# Patient Record
Sex: Female | Born: 1955
Health system: Southern US, Community
[De-identification: ages and names within clinical notes are randomized; demographics above are authoritative.]

## PROBLEM LIST (undated history)

## (undated) DIAGNOSIS — I10 Essential (primary) hypertension: Secondary | ICD-10-CM

## (undated) DIAGNOSIS — F329 Major depressive disorder, single episode, unspecified: Secondary | ICD-10-CM

## (undated) DIAGNOSIS — K222 Esophageal obstruction: Secondary | ICD-10-CM

## (undated) DIAGNOSIS — K219 Gastro-esophageal reflux disease without esophagitis: Secondary | ICD-10-CM

## (undated) DIAGNOSIS — M1990 Unspecified osteoarthritis, unspecified site: Secondary | ICD-10-CM

## (undated) DIAGNOSIS — Z9889 Other specified postprocedural states: Secondary | ICD-10-CM

## (undated) DIAGNOSIS — E28319 Asymptomatic premature menopause: Secondary | ICD-10-CM

## (undated) DIAGNOSIS — J189 Pneumonia, unspecified organism: Secondary | ICD-10-CM

## (undated) DIAGNOSIS — S8391XA Sprain of unspecified site of right knee, initial encounter: Secondary | ICD-10-CM

## (undated) DIAGNOSIS — M8580 Other specified disorders of bone density and structure, unspecified site: Secondary | ICD-10-CM

## (undated) DIAGNOSIS — F32A Depression, unspecified: Secondary | ICD-10-CM

## (undated) DIAGNOSIS — K5730 Diverticulosis of large intestine without perforation or abscess without bleeding: Secondary | ICD-10-CM

## (undated) DIAGNOSIS — K429 Umbilical hernia without obstruction or gangrene: Secondary | ICD-10-CM

## (undated) DIAGNOSIS — L600 Ingrowing nail: Secondary | ICD-10-CM

## (undated) DIAGNOSIS — G8929 Other chronic pain: Secondary | ICD-10-CM

## (undated) DIAGNOSIS — F419 Anxiety disorder, unspecified: Secondary | ICD-10-CM

## (undated) DIAGNOSIS — R112 Nausea with vomiting, unspecified: Secondary | ICD-10-CM

## (undated) DIAGNOSIS — F17200 Nicotine dependence, unspecified, uncomplicated: Secondary | ICD-10-CM

## (undated) DIAGNOSIS — R7303 Prediabetes: Secondary | ICD-10-CM

## (undated) DIAGNOSIS — R1031 Right lower quadrant pain: Secondary | ICD-10-CM

## (undated) DIAGNOSIS — E785 Hyperlipidemia, unspecified: Secondary | ICD-10-CM

## (undated) DIAGNOSIS — M65311 Trigger thumb, right thumb: Secondary | ICD-10-CM

## (undated) DIAGNOSIS — E559 Vitamin D deficiency, unspecified: Secondary | ICD-10-CM

## (undated) HISTORY — DX: Other chronic pain: G89.29

## (undated) HISTORY — DX: Ingrowing nail: L60.0

## (undated) HISTORY — DX: Essential (primary) hypertension: I10

## (undated) HISTORY — DX: Sprain of unspecified site of right knee, initial encounter: S83.91XA

## (undated) HISTORY — DX: Anxiety disorder, unspecified: F41.9

## (undated) HISTORY — DX: Major depressive disorder, single episode, unspecified: F32.9

## (undated) HISTORY — PX: UPPER GASTROINTESTINAL ENDOSCOPY: SHX188

## (undated) HISTORY — DX: Right lower quadrant pain: R10.31

## (undated) HISTORY — DX: Trigger thumb, right thumb: M65.311

## (undated) HISTORY — DX: Diverticulosis of large intestine without perforation or abscess without bleeding: K57.30

## (undated) HISTORY — DX: Vitamin D deficiency, unspecified: E55.9

## (undated) HISTORY — PX: BUNIONECTOMY: SHX129

## (undated) HISTORY — DX: Esophageal obstruction: K22.2

## (undated) HISTORY — DX: Unspecified osteoarthritis, unspecified site: M19.90

## (undated) HISTORY — DX: Depression, unspecified: F32.A

## (undated) HISTORY — PX: TONSILLECTOMY: SUR1361

## (undated) HISTORY — DX: Asymptomatic premature menopause: E28.319

## (undated) HISTORY — DX: Gastro-esophageal reflux disease without esophagitis: K21.9

## (undated) HISTORY — DX: Nicotine dependence, unspecified, uncomplicated: F17.200

## (undated) HISTORY — DX: Pneumonia, unspecified organism: J18.9

## (undated) HISTORY — DX: Other specified disorders of bone density and structure, unspecified site: M85.80

## (undated) HISTORY — DX: Hyperlipidemia, unspecified: E78.5

---

## 1983-03-20 HISTORY — PX: LAPAROSCOPIC OVARIAN CYSTECTOMY: SUR786

## 1986-03-19 HISTORY — PX: COLONOSCOPY: SHX174

## 1987-03-20 DIAGNOSIS — G8929 Other chronic pain: Secondary | ICD-10-CM

## 1987-03-20 HISTORY — DX: Other chronic pain: G89.29

## 1996-03-19 LAB — HM DEXA SCAN

## 1996-11-17 HISTORY — PX: COLONOSCOPY: SHX174

## 1998-08-09 ENCOUNTER — Other Ambulatory Visit: Admission: RE | Admit: 1998-08-09 | Discharge: 1998-08-09 | Payer: Self-pay | Admitting: Obstetrics and Gynecology

## 1999-08-21 ENCOUNTER — Other Ambulatory Visit: Admission: RE | Admit: 1999-08-21 | Discharge: 1999-08-21 | Payer: Self-pay | Admitting: Obstetrics and Gynecology

## 2000-08-30 ENCOUNTER — Other Ambulatory Visit: Admission: RE | Admit: 2000-08-30 | Discharge: 2000-08-30 | Payer: Self-pay | Admitting: Obstetrics and Gynecology

## 2001-03-19 HISTORY — PX: HYSTEROSCOPY: SHX211

## 2001-06-13 ENCOUNTER — Ambulatory Visit (HOSPITAL_COMMUNITY): Admission: RE | Admit: 2001-06-13 | Discharge: 2001-06-13 | Payer: Self-pay | Admitting: Obstetrics and Gynecology

## 2001-06-13 ENCOUNTER — Encounter (INDEPENDENT_AMBULATORY_CARE_PROVIDER_SITE_OTHER): Payer: Self-pay

## 2001-09-02 ENCOUNTER — Other Ambulatory Visit: Admission: RE | Admit: 2001-09-02 | Discharge: 2001-09-02 | Payer: Self-pay | Admitting: Obstetrics and Gynecology

## 2002-01-17 HISTORY — PX: COLONOSCOPY: SHX174

## 2002-09-14 ENCOUNTER — Other Ambulatory Visit: Admission: RE | Admit: 2002-09-14 | Discharge: 2002-09-14 | Payer: Self-pay | Admitting: Obstetrics and Gynecology

## 2004-01-14 ENCOUNTER — Other Ambulatory Visit: Admission: RE | Admit: 2004-01-14 | Discharge: 2004-01-14 | Payer: Self-pay | Admitting: Obstetrics and Gynecology

## 2004-11-15 ENCOUNTER — Ambulatory Visit (HOSPITAL_BASED_OUTPATIENT_CLINIC_OR_DEPARTMENT_OTHER): Admission: RE | Admit: 2004-11-15 | Discharge: 2004-11-15 | Payer: Self-pay | Admitting: Orthopedic Surgery

## 2004-11-15 ENCOUNTER — Ambulatory Visit (HOSPITAL_COMMUNITY): Admission: RE | Admit: 2004-11-15 | Discharge: 2004-11-15 | Payer: Self-pay | Admitting: Orthopedic Surgery

## 2004-11-17 HISTORY — PX: CHEILECTOMY: SHX1336

## 2005-03-26 ENCOUNTER — Other Ambulatory Visit: Admission: RE | Admit: 2005-03-26 | Discharge: 2005-03-26 | Payer: Self-pay | Admitting: Obstetrics and Gynecology

## 2006-02-22 ENCOUNTER — Encounter: Admission: RE | Admit: 2006-02-22 | Discharge: 2006-02-22 | Payer: Self-pay | Admitting: Obstetrics and Gynecology

## 2006-04-17 ENCOUNTER — Other Ambulatory Visit: Admission: RE | Admit: 2006-04-17 | Discharge: 2006-04-17 | Payer: Self-pay | Admitting: Obstetrics & Gynecology

## 2006-05-22 ENCOUNTER — Encounter: Admission: RE | Admit: 2006-05-22 | Discharge: 2006-05-22 | Payer: Self-pay | Admitting: Obstetrics and Gynecology

## 2006-05-29 ENCOUNTER — Encounter: Admission: RE | Admit: 2006-05-29 | Discharge: 2006-05-29 | Payer: Self-pay | Admitting: Obstetrics and Gynecology

## 2006-06-11 ENCOUNTER — Ambulatory Visit (HOSPITAL_BASED_OUTPATIENT_CLINIC_OR_DEPARTMENT_OTHER): Admission: RE | Admit: 2006-06-11 | Discharge: 2006-06-11 | Payer: Self-pay | Admitting: General Surgery

## 2006-06-11 ENCOUNTER — Encounter (INDEPENDENT_AMBULATORY_CARE_PROVIDER_SITE_OTHER): Payer: Self-pay | Admitting: *Deleted

## 2006-06-11 HISTORY — PX: BREAST EXCISIONAL BIOPSY: SUR124

## 2006-06-18 HISTORY — PX: MM BREAST STEREO BX*L*R/S: HXRAD495

## 2007-04-21 ENCOUNTER — Other Ambulatory Visit: Admission: RE | Admit: 2007-04-21 | Discharge: 2007-04-21 | Payer: Self-pay | Admitting: Obstetrics and Gynecology

## 2007-06-03 ENCOUNTER — Encounter: Admission: RE | Admit: 2007-06-03 | Discharge: 2007-06-03 | Payer: Self-pay | Admitting: Obstetrics and Gynecology

## 2008-03-09 ENCOUNTER — Ambulatory Visit: Payer: Self-pay | Admitting: Gastroenterology

## 2008-03-19 HISTORY — PX: COLONOSCOPY W/ BIOPSIES: SHX1374

## 2008-03-23 ENCOUNTER — Ambulatory Visit: Payer: Self-pay | Admitting: Gastroenterology

## 2008-04-15 ENCOUNTER — Emergency Department (HOSPITAL_COMMUNITY): Admission: EM | Admit: 2008-04-15 | Discharge: 2008-04-15 | Payer: Self-pay | Admitting: Emergency Medicine

## 2008-04-22 ENCOUNTER — Other Ambulatory Visit: Admission: RE | Admit: 2008-04-22 | Discharge: 2008-04-22 | Payer: Self-pay | Admitting: Obstetrics and Gynecology

## 2008-07-15 ENCOUNTER — Encounter: Admission: RE | Admit: 2008-07-15 | Discharge: 2008-07-15 | Payer: Self-pay | Admitting: Obstetrics and Gynecology

## 2009-08-22 ENCOUNTER — Encounter: Admission: RE | Admit: 2009-08-22 | Discharge: 2009-08-22 | Payer: Self-pay | Admitting: Obstetrics & Gynecology

## 2010-07-03 LAB — BASIC METABOLIC PANEL
Calcium: 9.5 mg/dL (ref 8.4–10.5)
Chloride: 107 mEq/L (ref 96–112)
GFR calc non Af Amer: >60 mL/min (ref >60)
Glucose, Bld: 98 mg/dL (ref 70–99)

## 2010-07-03 LAB — CBC
HCT: 39 % (ref 36.0–46.0)
Hemoglobin: 13.3 g/dL (ref 12.0–15.0)
MCHC: 34.1 g/dL (ref 30.0–36.0)
MCV: 91.9 fL (ref 78.0–100.0)
Platelets: 322 10*3/uL (ref 150–400)
WBC: 7.3 10*3/uL (ref 4.0–10.5)

## 2010-07-03 LAB — URINALYSIS, ROUTINE W REFLEX MICROSCOPIC
Bilirubin Urine: NEGATIVE
Glucose, UA: NEGATIVE mg/dL
Hgb urine dipstick: NEGATIVE
Specific Gravity, Urine: 1.001 — ABNORMAL LOW (ref 1.005–1.030)
Urobilinogen, UA: 0.2 mg/dL (ref 0.0–1.0)
pH: 7.5 (ref 5.0–8.0)

## 2010-07-03 LAB — DIFFERENTIAL
Basophils Absolute: 0 10*3/uL (ref 0.0–0.1)
Basophils Relative: 0 % (ref 0–1)
Eosinophils Relative: 1 % (ref 0–5)

## 2010-08-04 NOTE — Op Note (Signed)
NAME:  NABA, SNEED NO.:  [id number]   MEDICAL RECORD NO.:  [id number]          PATIENT TYPE:  AMB   LOCATION:  DSC                          FACILITY:  [facility]   PHYSICIAN:  Leonides Grills, M.D.     DATE OF BIRTH:  [birth year]   DATE OF PROCEDURE:  11/15/2004  DATE OF DISCHARGE:                                 OPERATIVE REPORT   PREOPERATIVE DIAGNOSIS:  Right hallux rigidus.   POSTOPERATIVE DIAGNOSIS:  Right hallux rigidus.   OPERATION:  Right great toe chielectomy.   ANESTHESIA:  General.   SURGEON:  Leonides Grills, M.D.   ASSISTANT:  Lianne Cure, P.A.-C.   ESTIMATED BLOOD LOSS:  Minimal.   TOURNIQUET TIME:  Was 30 minutes.   COMPLICATIONS:  None.   DISPOSITION:  Stable to the PAR.   INDICATIONS FOR PROCEDURE:  This is a 55 year old female who has had  longstanding dorsal right great toe pain that was interfering with her life  and with things she wants to do. She was consented for the above procedure.  All risks, which include infection, nerve or vessel injury, persistent pain,  worsening pain, stiffness, arthritis, prolonged recovery and possible future  fusion were all explained.  Questions were encouraged and answered.   DESCRIPTION OF PROCEDURE:  The patient was brought to the operating room and  placed in the supine position.  After adequate general endotracheal tube  anesthesia was administered, as well as Ancef 1 gram IV piggyback, the right  lower extremity was then prepped and draped in a sterile manner over a  proximally-placed thigh tourniquet.  The limb was gravity exsanguinated and  the tourniquet was elevated to 290 mmHg.  A longitudinal incision just  medial to the EHL tendon was then made.  Dissection was carried through the  skin.  Hemostasis was obtained.  The EHL tendon was then protected within  its sheath and was not violated.  A longitudinal capsulotomy was then made.  There was a large amount of effusion and synovitis in this  area, and spur  formation.  There was a loose body as well.  The loose body was removed with  a rongeur and scalpel.  We then removed the dorsal spur with the sagittal  saw.  Once this was done, the medial and lateral gutters were then debrided  with the rongeur.  Range of motion of the toe was outstanding, almost 90  degrees dorsiflexion.  The joint and area were copiously irrigated with  normal saline.  Bone wax was applied to exposed bony surfaces.  The capsule  was  closed with #3-0 Vicryl suture.  The tourniquet was deflated.  Hemostasis  was obtained.  The subcu was closed with #3-0 Vicryl.  The skin was closed  with #4-0 nylon.  A sterile dressing was applied.  A hard-soled shoe was  applied.   The patient was stable to the PAR.      Leonides Grills, M.D.  Electronically Signed     PB/MEDQ  D:  11/15/2004  T:  11/15/2004  Job:  [id number]

## 2010-08-04 NOTE — Op Note (Signed)
NAME:  Alexandria Mercado, Alexandria Mercado              ACCOUNT NO.:  [id number]   MEDICAL RECORD NO.:  [id number]          PATIENT TYPE:  AMB   LOCATION:  DSC                          FACILITY:  [facility]   PHYSICIAN:  Rose Phi. Maple Hudson, M.D.   DATE OF BIRTH:  1955-10-28   DATE OF PROCEDURE:  06/11/2006  DATE OF DISCHARGE:                               OPERATIVE REPORT   PREOPERATIVE DIAGNOSIS:  Intraductal papilloma of the right breast.   POSTOPERATIVE DIAGNOSIS:  Intraductal papilloma of the right breast.   OPERATION:  Excision of duct system of the right breast.   SURGEON:  Rose Phi. Maple Hudson, M.D.   ANESTHESIA:  MAC.   OPERATIVE PROCEDURE:  The patient was placed on the operating table with  arms extended on the arm board and the right breast prepped and draped  in the usual fashion.  Her discharge has always then between the 12 and  two o'clock position.  Two attempts at ductograms had been failures.   After prepping and draping the breast.  A circumareolar incision  extending from the two to three o'clock position was outlined with a  marking pencil and the area thoroughly infiltrated with local anesthetic  mixture.  The incision was then made and dissected up an areolar flap.  As you approached underneath the nipple, one could see the tissue and  the bloody fluid within the duct system.  I excised this whole wedge of  duct tissue including some extra tissue deep to it where it extended a  little more deeply in the central portion.  We had good hemostasis with  cautery.  A subcuticular closure of 4-0 Monocryl with Dermabond was then  carried out.  A light dressing applied.  The patient transferred to the  recovery room in satisfactory condition having tolerated the procedure  well.      Rose Phi. Maple Hudson, M.D.  Electronically Signed     PRY/MEDQ  D:  06/11/2006  T:  06/11/2006  Job:  [id number]

## 2010-08-04 NOTE — Op Note (Signed)
[facility]  Patient:    Alexandria Mercado, Alexandria Mercado Visit Number: [id number] MRN: [id number]          Service Type: DSU Location: [facility] Attending Physician:  Jenean Lindau Dictated by:   Laqueta Linden, M.D. Proc. Date: 06/13/01 Admit Date:  06/13/2001                             Operative Report  PREOPERATIVE DIAGNOSIS:       Endometrial polyp.  POSTOPERATIVE DIAGNOSIS:      Endometrial polyp.  OPERATION:                    Hysteroscopic resection.  SURGEON:                      Laqueta Linden, M.D.  ANESTHESIA:                   General LMA.  ESTIMATED BLOOD LOSS:         Less than 20 cc.  SORBITOL NET INTAKE:          70 cc.  COMPLICATIONS:                None.  INDICATIONS:                  The patient is a 55 year old female who underwent premature menopause and is on hormonal replacement therapy.  She was having some adnexal pain and fear of ovarian malignancy, and underwent a pelvic ultrasound which revealed completely normal ovaries.  It did however reveal the suggestion of an endometrial polyp.  Sonohistogram confirmed the presence of a 1.6 cm anterior lower uterine segment endometrial polyp.  The patient had had no postmenopausal bleeding.  Due to the presence of the possible likelihood of eventual bleeding from this, it was elected to proceed with elective resection of the polyp.  The patient has seen the informed consent film, and has voiced the understanding of risks, benefits, alternatives, and complications, and agrees to proceed.  DESCRIPTION OF PROCEDURE:     The patient was taken to the operating room and after proper identification and consents were ascertained, she was placed on the operating table in the supine position.  After general LMA anesthesia was induced, she was placed in the [place] stirrups, and the perineum and vagina were prepped and draped in a routine sterile fashion.  A transurethral Foley was placed which was  removed at the conclusion of the procedure.  Bimanual examination confirmed a mid plane to retroverted normal size mobile uterus.  A speculum was placed in the vagina and the cervix grasped with a single tooth tenaculum.  The internal os was gently dilated to a #33 Pratt dilator.  The resectoscope with double loupe was then inserted under direct vision with video with continuous monitoring of sorbitol intake and output.  The endocervical canal was free of lesions.  The anterior lower uterine segment and left lateral endometrium were obscured by a large broad-based polyp which extended up towards the fundus.  The left tubal ostia was not visualized prior to dissection of the polyp.  The right cornual region was visualized.  There were no other endometrial lesions identified.  The loupe was placed on routine settings and the polyp was resected in a systematic manner with cauterization of the base for hemostasis.  All pieces were evacuated and sent as a  separate specimen.  At this point, the left tubal ostia was visualized and appeared within normal limits.  No other lesions were noted.  Hemostasis was excellent.  The scope was then withdrawn.  Sharp curettage of the remainder of the endometrial cavity was then performed with a scant amount of tissue obtained.  This was sent as a separate specimen.  The tenaculum was then removed.  The tenaculum site was hemostatic.  There was a scant amount of bloody fluid per os.  Net sorbitol intake 70 cc.  Complications none.  The Foley catheter was removed.  The patient was awakened and stable and transferred to the recovery room.  She was given 30 mg of Toradol IV and 30 mg IM for control of cramping.  She will be observed and discharged in the recovery room per anesthesia protocol.  She is to follow up in the office in four weeks times or sooner if she experiences excessive pain, fever, bleeding, or other concerns.  She is given routine written and  discharge instructions, and told to take Advil or Aleve as needed for cramping. Dictated by:   Laqueta Linden, M.D. Attending Physician:  Jenean Lindau DD:  06/13/01 TD:  06/14/01 Job: [id number]

## 2010-11-21 ENCOUNTER — Other Ambulatory Visit (INDEPENDENT_AMBULATORY_CARE_PROVIDER_SITE_OTHER): Payer: Self-pay | Admitting: Surgery

## 2010-11-21 DIAGNOSIS — Z1231 Encounter for screening mammogram for malignant neoplasm of breast: Secondary | ICD-10-CM

## 2010-12-14 ENCOUNTER — Ambulatory Visit: Payer: Self-pay

## 2011-04-12 ENCOUNTER — Other Ambulatory Visit: Payer: Self-pay | Admitting: Nurse Practitioner

## 2011-04-12 DIAGNOSIS — Z1231 Encounter for screening mammogram for malignant neoplasm of breast: Secondary | ICD-10-CM

## 2011-05-10 ENCOUNTER — Ambulatory Visit
Admission: RE | Admit: 2011-05-10 | Discharge: 2011-05-10 | Disposition: A | Discharge status: Home / Self Care | Payer: BC Managed Care – PPO | Source: Ambulatory Visit | Attending: Nurse Practitioner | Admitting: Nurse Practitioner

## 2011-05-10 DIAGNOSIS — Z1231 Encounter for screening mammogram for malignant neoplasm of breast: Secondary | ICD-10-CM

## 2011-10-30 ENCOUNTER — Other Ambulatory Visit: Payer: Self-pay | Admitting: Obstetrics and Gynecology

## 2011-10-30 DIAGNOSIS — N644 Mastodynia: Secondary | ICD-10-CM

## 2011-11-08 ENCOUNTER — Ambulatory Visit
Admission: RE | Admit: 2011-11-08 | Discharge: 2011-11-08 | Disposition: A | Discharge status: Home / Self Care | Payer: BC Managed Care – PPO | Source: Ambulatory Visit | Attending: Obstetrics and Gynecology | Admitting: Obstetrics and Gynecology

## 2011-11-08 DIAGNOSIS — N644 Mastodynia: Secondary | ICD-10-CM

## 2012-06-09 ENCOUNTER — Other Ambulatory Visit (HOSPITAL_COMMUNITY): Payer: Self-pay | Admitting: Family Medicine

## 2012-06-09 DIAGNOSIS — Z1231 Encounter for screening mammogram for malignant neoplasm of breast: Secondary | ICD-10-CM

## 2012-06-18 ENCOUNTER — Ambulatory Visit (HOSPITAL_COMMUNITY)
Admission: RE | Admit: 2012-06-18 | Discharge: 2012-06-18 | Disposition: A | Discharge status: Home / Self Care | Payer: BC Managed Care – PPO | Source: Ambulatory Visit | Attending: Family Medicine | Admitting: Family Medicine

## 2012-06-18 DIAGNOSIS — Z1231 Encounter for screening mammogram for malignant neoplasm of breast: Secondary | ICD-10-CM | POA: Insufficient documentation

## 2012-10-30 ENCOUNTER — Encounter: Payer: Self-pay | Admitting: *Deleted

## 2012-10-31 ENCOUNTER — Encounter: Payer: Self-pay | Admitting: Nurse Practitioner

## 2012-10-31 ENCOUNTER — Ambulatory Visit (INDEPENDENT_AMBULATORY_CARE_PROVIDER_SITE_OTHER): Payer: BC Managed Care – PPO | Admitting: Nurse Practitioner

## 2012-10-31 VITALS — BP 128/72 | HR 76 | Resp 14 | Ht 68.0 in | Wt 224.0 lb

## 2012-10-31 DIAGNOSIS — Z0000 Encounter for general adult medical examination without abnormal findings: Secondary | ICD-10-CM

## 2012-10-31 DIAGNOSIS — Z01419 Encounter for gynecological examination (general) (routine) without abnormal findings: Secondary | ICD-10-CM

## 2012-10-31 LAB — POCT URINALYSIS DIPSTICK
Urobilinogen, UA: NEGATIVE
pH, UA: 6

## 2012-10-31 NOTE — Patient Instructions (Signed)

## 2012-10-31 NOTE — Progress Notes (Signed)
57 y.o. G0P0 Married Caucasian Fe here for annual exam.  Some increase in anxiety and now on Buspar. Still 'warm' in general without a lot of sleep disturbances.  No LMP recorded. Patient is postmenopausal.          Sexually active: no  The current method of family planning is post menopausal status.    Exercising: yes  walk and exercise bike Smoker:  no  Health Maintenance: Pap:  10/30/2011  Normal with negative HR HPV MMG: 06/19/12 normal Colonoscopy:  03/2008 no polyps, scattered diverticula BMD:   03/1996  -1.9/-0.9 plans on repeat within next year TDaP:  04/25/2009 Labs: PCP maintains labs.    reports that she has never smoked. She has never used smokeless tobacco. She reports that she does not drink alcohol or use illicit drugs.  Past Medical History  Diagnosis Date  . Chronic RLQ pain   . Smoker   . Amenorrhea   . Anovulation   . Osteoporosis   . Depression     grief counseling    Past Surgical History  Procedure Laterality Date  . Laparoscopic ovarian cystectomy Right 1985  . Hysteroscopic resection  05/2001   . Cheilectomy Right     great toe   . Mm breast stereo bx*l*r/s Right 06/2006     bloody nipple discharge   . Mouth surgery      Current Outpatient Prescriptions  Medication Sig Dispense Refill  . ALPRAZolam (XANAX) 0.25 MG tablet Take 0.25 mg by mouth at bedtime as needed for sleep.      . Ascorbic Acid (VITAMIN C) 100 MG tablet Take 100 mg by mouth daily.      . busPIRone (BUSPAR) 5 MG tablet Take 5 mg by mouth 2 (two) times daily.      . Calcium Carbonate (CALTRATE 600 PO) Take by mouth.      . cholecalciferol (VITAMIN D) 1000 UNITS tablet Take 1,000 Units by mouth daily.      . fish oil-omega-3 fatty acids 1000 MG capsule Take 2 g by mouth 2 (two) times daily.      . Multiple Vitamin (MULTIVITAMIN) tablet Take 1 tablet by mouth daily.       No current facility-administered medications for this visit.    Family History  Problem Relation Age of Onset  .  Cancer Maternal Uncle     colon cancer    ROS:  Pertinent items are noted in HPI.  Otherwise, a comprehensive ROS was negative.  Exam:   BP 128/72  Pulse 76  Resp 14  Ht 5\' 8"  (1.727 m)  Wt 224 lb (101.606 kg)  BMI 34.07 kg/m2 Height: 5\' 8"  (172.7 cm)  Ht Readings from Last 3 Encounters:  10/31/12 5\' 8"  (1.727 m)    General appearance: alert, cooperative and appears stated age Head: Normocephalic, without obvious abnormality, atraumatic Neck: no adenopathy, supple, symmetrical, trachea midline and thyroid normal to inspection and palpation Lungs: clear to auscultation bilaterally Breasts: normal appearance, no masses or tenderness Heart: regular rate and rhythm Abdomen: soft, non-tender; no masses,  no organomegaly Extremities: extremities normal, atraumatic, no cyanosis or edema Skin: Skin color, texture, turgor normal. No rashes or lesions Lymph nodes: Cervical, supraclavicular, and axillary nodes normal. No abnormal inguinal nodes palpated Neurologic: Grossly normal   Pelvic: External genitalia:  no lesions              Urethra:  normal appearing urethra with no masses, tenderness or lesions  Bartholin's and Skene's: normal                 Vagina: normal appearing vagina with normal color and discharge, no lesions              Cervix: anteverted              Pap taken: no Bimanual Exam:  Uterus:  normal size, contour, position, consistency, mobility, non-tender              Adnexa: no mass, fullness, tenderness               Rectovaginal: Confirms               Anus:  normal sphincter tone, no lesions  A:  Well Woman with normal exam  Premature Menopause on HRT age 36 - 2007  History of anxiety - recent increase now on new med's.  P:   Pap smear as per guidelines pap not done   Mammogram due 4/15  Counseled on breast self exam, adequate intake of calcium and vitamin D, diet and exercise return annually or prn  An After Visit Summary was printed and  given to the patient.

## 2012-11-03 NOTE — Progress Notes (Signed)
Encounter reviewed by Dr. Brook Silva.  

## 2013-01-22 ENCOUNTER — Other Ambulatory Visit: Payer: Self-pay

## 2013-03-11 ENCOUNTER — Encounter: Payer: Self-pay | Admitting: Nurse Practitioner

## 2013-05-20 ENCOUNTER — Other Ambulatory Visit (HOSPITAL_COMMUNITY): Payer: Self-pay | Admitting: Family Medicine

## 2013-05-20 DIAGNOSIS — Z1231 Encounter for screening mammogram for malignant neoplasm of breast: Secondary | ICD-10-CM

## 2013-06-22 ENCOUNTER — Ambulatory Visit (HOSPITAL_COMMUNITY)
Admission: RE | Admit: 2013-06-22 | Discharge: 2013-06-22 | Disposition: A | Discharge status: Home / Self Care | Payer: BC Managed Care – PPO | Source: Ambulatory Visit | Attending: Family Medicine | Admitting: Family Medicine

## 2013-06-22 DIAGNOSIS — Z1231 Encounter for screening mammogram for malignant neoplasm of breast: Secondary | ICD-10-CM | POA: Insufficient documentation

## 2013-10-06 ENCOUNTER — Encounter: Payer: Self-pay | Admitting: Gastroenterology

## 2013-10-06 ENCOUNTER — Telehealth: Payer: Self-pay | Admitting: Gastroenterology

## 2013-10-06 NOTE — Telephone Encounter (Signed)
Pt states she has seen some blood in her stool on several occasions with some white/mucous material. Pt is concerned that she may need to have a colon done. Pt scheduled to see Alonza Bogus PA 10/14/13@1 :30pm. Pt aware of appt.

## 2013-10-09 ENCOUNTER — Encounter: Payer: Self-pay | Admitting: *Deleted

## 2013-10-14 ENCOUNTER — Ambulatory Visit: Payer: BC Managed Care – PPO | Admitting: Gastroenterology

## 2013-11-26 ENCOUNTER — Ambulatory Visit: Payer: BC Managed Care – PPO | Admitting: Nurse Practitioner

## 2013-12-07 ENCOUNTER — Ambulatory Visit: Payer: BC Managed Care – PPO | Admitting: Nurse Practitioner

## 2013-12-16 ENCOUNTER — Ambulatory Visit: Payer: BC Managed Care – PPO | Admitting: Gastroenterology

## 2014-03-26 ENCOUNTER — Ambulatory Visit: Payer: BC Managed Care – PPO | Admitting: Nurse Practitioner

## 2014-05-26 ENCOUNTER — Encounter: Payer: Self-pay | Admitting: Nurse Practitioner

## 2014-05-26 ENCOUNTER — Ambulatory Visit (INDEPENDENT_AMBULATORY_CARE_PROVIDER_SITE_OTHER): Payer: BLUE CROSS/BLUE SHIELD | Admitting: Nurse Practitioner

## 2014-05-26 VITALS — BP 124/80 | HR 80 | Resp 20 | Ht 68.25 in | Wt 176.0 lb

## 2014-05-26 DIAGNOSIS — Z01419 Encounter for gynecological examination (general) (routine) without abnormal findings: Secondary | ICD-10-CM | POA: Diagnosis not present

## 2014-05-26 DIAGNOSIS — R350 Frequency of micturition: Secondary | ICD-10-CM | POA: Diagnosis not present

## 2014-05-26 DIAGNOSIS — E288 Other ovarian dysfunction: Secondary | ICD-10-CM | POA: Diagnosis not present

## 2014-05-26 DIAGNOSIS — E559 Vitamin D deficiency, unspecified: Secondary | ICD-10-CM

## 2014-05-26 DIAGNOSIS — Z0000 Encounter for general adult medical examination without abnormal findings: Secondary | ICD-10-CM | POA: Diagnosis not present

## 2014-05-26 DIAGNOSIS — Z1211 Encounter for screening for malignant neoplasm of colon: Secondary | ICD-10-CM

## 2014-05-26 DIAGNOSIS — E2839 Other primary ovarian failure: Secondary | ICD-10-CM

## 2014-05-26 LAB — POCT URINALYSIS DIPSTICK
BILIRUBIN UA: NEGATIVE
GLUCOSE UA: NEGATIVE
Ketones, UA: NEGATIVE
Leukocytes, UA: NEGATIVE
Nitrite, UA: NEGATIVE
PROTEIN UA: NEGATIVE
RBC UA: NEGATIVE
UROBILINOGEN UA: NEGATIVE
pH, UA: 5

## 2014-05-26 NOTE — Progress Notes (Signed)
59 y.o. G0P0 WM Fe here for annual exam.  She has been on [employer] this past year and has a weight loss of 57 lbs.  Patient's last menstrual period was 09/17/1990.         Sexually active: No.  The current method of family planning is vasectomy.    Exercising: Yes.    walk & bike Smoker:  no  Health Maintenance: Pap: 10-30-11 neg HPV HR neg MMG:  06-22-13 category b density,birads 2:neg Colonoscopy:  2010 repeat in 7 years. BMD:   1998 now due TDaP:  04/25/2009 Shingles: 11/2013 Labs: PCP and was normal - Dr. Inda Merlin; Poct urine-neg Self breast exam: done oc   reports that she has quit smoking. She has never used smokeless tobacco. She reports that she does not drink alcohol or use illicit drugs.  Past Medical History  Diagnosis Date  . Chronic RLQ pain 1989    secondary to adhesions  . Smoker   . Amenorrhea   . Anovulation 8/91  . Osteoporosis   . Depression     grief counseling  . Premature menopause age 34    On HRT age 32 - 2007  . Diverticulosis of colon (without mention of hemorrhage)     Past Surgical History  Procedure Laterality Date  . Laparoscopic ovarian cystectomy Right 1985  . Cheilectomy Right 9/06    great toe   . Mm breast stereo bx*l*r/s Right 06/2006     bloody nipple discharge - path reveals intraductal papilloma Dr. Annamaria Boots  . Mouth surgery    . Colonoscopy  1988    normal  . Colonoscopy  9/98    normal  . Colonoscopy  11/03    normal  . Colonoscopy w/ biopsies  03/2008    mild diverticula - recheck 7 years  . Hysteroscopy  06/13/01    Resection of Endo Polyp    Current Outpatient Prescriptions  Medication Sig Dispense Refill  . ALPRAZolam (XANAX) 0.5 MG tablet Take 0.5 mg by mouth every 8 (eight) hours as needed. for anxiety  1  . Ascorbic Acid (VITAMIN C) 100 MG tablet Take 100 mg by mouth daily.    . busPIRone (BUSPAR) 5 MG tablet Take 5 mg by mouth 2 (two) times daily.    . Calcium Carbonate (CALTRATE 600 PO) Take by mouth.    .  cholecalciferol (VITAMIN D) 1000 UNITS tablet Take 1,000 Units by mouth daily.    . cyclobenzaprine (FLEXERIL) 10 MG tablet as needed.  1  . fish oil-omega-3 fatty acids 1000 MG capsule Take 1 g by mouth daily.     . Multiple Vitamin (MULTIVITAMIN) tablet Take 1 tablet by mouth daily.     No current facility-administered medications for this visit.    Family History  Problem Relation Age of Onset  . Colon cancer Maternal Uncle   . Uterine cancer Mother   . Hyperlipidemia Mother     ROS:  Pertinent items are noted in HPI.  Otherwise, a comprehensive ROS was negative.  Exam:   BP 124/80 mmHg  Pulse 80  Resp 20  Ht 5' 8.25" (1.734 m)  Wt 176 lb (79.833 kg)  BMI 26.55 kg/m2  LMP 09/17/1990 Height: 5' 8.25" (173.4 cm) Ht Readings from Last 3 Encounters:  05/26/14 5' 8.25" (1.734 m)  10/31/12 5\' 8"  (1.727 m)    General appearance: alert, cooperative and appears stated age Head: Normocephalic, without obvious abnormality, atraumatic Neck: no adenopathy, supple, symmetrical, trachea midline and thyroid  normal to inspection and palpation Lungs: clear to auscultation bilaterally Breasts: normal appearance, no masses or tenderness Heart: regular rate and rhythm Abdomen: soft, non-tender; no masses,  no organomegaly Extremities: extremities normal, atraumatic, no cyanosis or edema Skin: Skin color, texture, turgor normal. No rashes or lesions Lymph nodes: Cervical, supraclavicular, and axillary nodes normal. No abnormal inguinal nodes palpated Neurologic: Grossly normal   Pelvic: External genitalia:  no lesions              Urethra:  normal appearing urethra with no masses, tenderness or lesions              Bartholin's and Skene's: normal                 Vagina: normal appearing vagina with normal color and discharge, no lesions              Cervix: anteverted              Pap taken: Yes.   Bimanual Exam:  Uterus:  normal size, contour, position, consistency, mobility,  non-tender              Adnexa: no mass, fullness, tenderness               Rectovaginal: Confirms               Anus:  normal sphincter tone, no lesions  Chaperone present: Yes  A:  Well Woman with normal exam  Premature Menopause on HRT age 59  HRT from 7/92- 2004, restarted 7/05 for short time. History of anxiety   Vit D deficiency  R/O UTI - asymptomatic  Weight loss intentally  P:   Reviewed health and wellness pertinent to exam  Pap smear taken today  Mammogram is due 4/16 and will get [facility] at same time - order is placed  Recheck Vit D and follow, and urine culture  Counseled on breast self exam, mammography screening, adequate intake of calcium and vitamin D, diet and exercise, Kegel's exercises return annually or prn  An After Visit Summary was printed and given to the patient.

## 2014-05-26 NOTE — Patient Instructions (Signed)

## 2014-05-27 LAB — URINE CULTURE
Colony Count: NO GROWTH
Organism ID, Bacteria: NO GROWTH

## 2014-05-27 LAB — VITAMIN D 25 HYDROXY (VIT D DEFICIENCY, FRACTURES): VIT D 25 HYDROXY: 35 ng/mL (ref 30–100)

## 2014-05-28 ENCOUNTER — Other Ambulatory Visit: Payer: Self-pay | Admitting: Nurse Practitioner

## 2014-05-28 ENCOUNTER — Telehealth: Payer: Self-pay | Admitting: *Deleted

## 2014-05-28 DIAGNOSIS — Z1231 Encounter for screening mammogram for malignant neoplasm of breast: Secondary | ICD-10-CM

## 2014-05-28 NOTE — Telephone Encounter (Signed)
-----   Message from Regina Eck, CNM sent at 05/28/2014  9:32 AM EST ----- Reviewed urine culture no growth no indication of infection Vitamin D low normal if not taking Vitamin D would recommend OTC Vit D 3 1000 IU daily

## 2014-05-28 NOTE — Telephone Encounter (Signed)
Pt notified in result note.  Closing encounter. 

## 2014-05-28 NOTE — Telephone Encounter (Signed)
I have attempted to contact this patient by phone with the following results: left message to return call to [place] at [phone] answering machine (home per [facility]). No personal information given. [phone] (Home) *Preferred*

## 2014-05-31 LAB — IPS PAP TEST WITH HPV

## 2014-05-31 NOTE — Progress Notes (Signed)
Encounter reviewed by Dr. Brook Silva.  

## 2014-06-09 LAB — FECAL OCCULT BLOOD, IMMUNOCHEMICAL: IFOBT: NEGATIVE

## 2014-06-09 NOTE — Addendum Note (Signed)
Addended by: Graylon Good on: 06/09/2014 09:46 AM   Modules accepted: Orders

## 2014-06-29 ENCOUNTER — Ambulatory Visit (HOSPITAL_COMMUNITY)
Admission: RE | Admit: 2014-06-29 | Discharge: 2014-06-29 | Disposition: A | Discharge status: Home / Self Care | Payer: BLUE CROSS/BLUE SHIELD | Source: Ambulatory Visit | Attending: Nurse Practitioner | Admitting: Nurse Practitioner

## 2014-06-29 DIAGNOSIS — Z1231 Encounter for screening mammogram for malignant neoplasm of breast: Secondary | ICD-10-CM | POA: Insufficient documentation

## 2014-06-29 DIAGNOSIS — Z13820 Encounter for screening for osteoporosis: Secondary | ICD-10-CM | POA: Diagnosis not present

## 2014-06-29 DIAGNOSIS — E2839 Other primary ovarian failure: Secondary | ICD-10-CM

## 2014-06-29 DIAGNOSIS — Z780 Asymptomatic menopausal state: Secondary | ICD-10-CM | POA: Insufficient documentation

## 2014-06-29 DIAGNOSIS — E288 Other ovarian dysfunction: Secondary | ICD-10-CM

## 2014-07-19 ENCOUNTER — Telehealth: Payer: Self-pay | Admitting: Nurse Practitioner

## 2014-07-19 NOTE — Telephone Encounter (Signed)
Patient given message from Milford Cage, [place] and verbalized understanding of instruction and plan of care going forward. Routing to provider for final review. Patient agreeable to disposition. Will close encounter

## 2014-07-19 NOTE — Telephone Encounter (Signed)
Patient calling for bone density test results done 06/29/14 at [facility].

## 2014-07-19 NOTE — Telephone Encounter (Signed)
Entered by Antonietta Barcelona, FNP at 06/29/2014 9:07 PM    Alexandria Mercado, the Bone density test 06/29/14 shows the spine and right hip to be in the low normal range. Please continue to stay current on walking, calcium, and vit D to prevent further loss. Repeat exam in 2-3 years.

## 2014-10-05 ENCOUNTER — Encounter: Payer: Self-pay | Admitting: Gastroenterology

## 2015-05-13 ENCOUNTER — Encounter: Payer: Self-pay | Admitting: Gastroenterology

## 2015-05-23 ENCOUNTER — Other Ambulatory Visit: Payer: Self-pay

## 2015-05-23 DIAGNOSIS — Z1231 Encounter for screening mammogram for malignant neoplasm of breast: Secondary | ICD-10-CM

## 2015-05-31 ENCOUNTER — Encounter: Payer: Self-pay | Admitting: Nurse Practitioner

## 2015-05-31 ENCOUNTER — Ambulatory Visit (INDEPENDENT_AMBULATORY_CARE_PROVIDER_SITE_OTHER): Payer: BLUE CROSS/BLUE SHIELD | Admitting: Nurse Practitioner

## 2015-05-31 VITALS — BP 136/84 | HR 60 | Ht 68.0 in | Wt 177.0 lb

## 2015-05-31 DIAGNOSIS — Z0000 Encounter for general adult medical examination without abnormal findings: Secondary | ICD-10-CM | POA: Diagnosis not present

## 2015-05-31 DIAGNOSIS — Z01419 Encounter for gynecological examination (general) (routine) without abnormal findings: Secondary | ICD-10-CM | POA: Diagnosis not present

## 2015-05-31 DIAGNOSIS — E2839 Other primary ovarian failure: Secondary | ICD-10-CM

## 2015-05-31 DIAGNOSIS — F411 Generalized anxiety disorder: Secondary | ICD-10-CM | POA: Diagnosis not present

## 2015-05-31 DIAGNOSIS — E288 Other ovarian dysfunction: Secondary | ICD-10-CM | POA: Diagnosis not present

## 2015-05-31 DIAGNOSIS — E559 Vitamin D deficiency, unspecified: Secondary | ICD-10-CM | POA: Diagnosis not present

## 2015-05-31 LAB — COMPREHENSIVE METABOLIC PANEL
ALBUMIN: 4.5 g/dL (ref 3.6–5.1)
ALT: 12 U/L (ref 6–29)
AST: 15 U/L (ref 10–35)
Alkaline Phosphatase: 71 U/L (ref 33–130)
BUN: 15 mg/dL (ref 7–25)
CHLORIDE: 100 mmol/L (ref 98–110)
CO2: 29 mmol/L (ref 20–31)
Calcium: 9.8 mg/dL (ref 8.6–10.4)
Creat: 0.59 mg/dL (ref 0.50–0.99)
Glucose, Bld: 76 mg/dL (ref 65–99)
Potassium: 4.7 mmol/L (ref 3.5–5.3)
Sodium: 140 mmol/L (ref 135–146)
TOTAL PROTEIN: 6.7 g/dL (ref 6.1–8.1)
Total Bilirubin: 0.4 mg/dL (ref 0.2–1.2)

## 2015-05-31 LAB — TSH: TSH: 1.39 m[IU]/L

## 2015-05-31 LAB — POCT URINALYSIS DIPSTICK
Bilirubin, UA: NEGATIVE
Glucose, UA: NEGATIVE
KETONES UA: NEGATIVE
Leukocytes, UA: NEGATIVE
Nitrite, UA: NEGATIVE
Protein, UA: NEGATIVE
RBC UA: NEGATIVE
Urobilinogen, UA: NEGATIVE
pH, UA: 7

## 2015-05-31 NOTE — Patient Instructions (Signed)

## 2015-05-31 NOTE — Progress Notes (Signed)
Encounter reviewed by Dr. Brook Amundson C. Silva.  

## 2015-05-31 NOTE — Progress Notes (Signed)
Patient ID: Alexandria Mercado, female   DOB: Jul 26, 1955, 60 y.o.   MRN: [id number]  60 y.o. G0P0 Married  Caucasian Fe here for annual exam.  Feels well. No new diagnosis.  Still working on weight loss. Lesion right leg that has been there for a long time but now with changes -plans to see Dr. Martinique.  Patient's last menstrual period was 09/17/1990 (approximate).          Sexually active: No.  The current method of family planning is none.    Exercising: Yes.    walking and biking Smoker:  no  Health Maintenance: Pap: 05/26/14, Negative with neg HR HPV MMG:06/29/14, Bi-Rads 1: Negative, appointment 07/04/15 Colonoscopy: 2010 repeat in 7 years.  Will schedule BMD:06/29/14 T Score, -1.5 Spine / -1.1 Right Femur Neck / -0.6 Left Femur Neck TDaP: 04/25/2009 Shingles: 11/2013 Pneumonia: Not indicated due to age Hep C and HIV: done today Labs: Dr. Inda Merlin  Urine: Negative   reports that she has quit smoking. She has never used smokeless tobacco. She reports that she does not drink alcohol or use illicit drugs.  Past Medical History  Diagnosis Date  . Chronic RLQ pain 1989    secondary to adhesions  . Smoker   . Amenorrhea   . Anovulation 8/91  . Osteoporosis   . Depression     grief counseling  . Premature menopause age 32    On HRT age 54 - 2007  . Diverticulosis of colon (without mention of hemorrhage)     Past Surgical History  Procedure Laterality Date  . Laparoscopic ovarian cystectomy Right 1985  . Cheilectomy Right 9/06    great toe   . Mm breast stereo bx*l*r/s Right 06/2006     bloody nipple discharge - path reveals intraductal papilloma Dr. Annamaria Boots  . Mouth surgery    . Colonoscopy  1988    normal  . Colonoscopy  9/98    normal  . Colonoscopy  11/03    normal  . Colonoscopy w/ biopsies  03/2008    mild diverticula - recheck 7 years  . Hysteroscopy  06/13/01    Resection of Endo Polyp    Current Outpatient Prescriptions  Medication Sig Dispense Refill  . ALPRAZolam  (XANAX) 0.5 MG tablet Take 0.5 mg by mouth every 8 (eight) hours as needed. for anxiety  1  . busPIRone (BUSPAR) 5 MG tablet Take 5 mg by mouth 2 (two) times daily.    . Calcium Carb-Cholecalciferol (CALTRATE 600+D) 600-800 MG-UNIT TABS Take 1 tablet by mouth daily.    . cholecalciferol (VITAMIN D) 1000 UNITS tablet Take 1,000 Units by mouth daily.    . cyclobenzaprine (FLEXERIL) 10 MG tablet as needed.  1  . fish oil-omega-3 fatty acids 1000 MG capsule Take 1 g by mouth daily.     . Multiple Vitamin (MULTIVITAMIN) tablet Take 1 tablet by mouth daily.    . vitamin C (ASCORBIC ACID) 500 MG tablet Take 500 mg by mouth daily.     No current facility-administered medications for this visit.    Family History  Problem Relation Age of Onset  . Colon cancer Maternal Uncle   . Uterine cancer Mother   . Hyperlipidemia Mother     ROS:  Pertinent items are noted in HPI.  Otherwise, a comprehensive ROS was negative.  Exam:   BP 136/84 mmHg  Pulse 60  Ht 5\' 8"  (1.727 m)  Wt 177 lb (80.287 kg)  BMI 26.92 kg/m2  LMP  09/17/1990 (Approximate) Height: 5\' 8"  (172.7 cm) Ht Readings from Last 3 Encounters:  05/31/15 5\' 8"  (1.727 m)  05/26/14 5' 8.25" (1.734 m)  10/31/12 5\' 8"  (1.727 m)    General appearance: alert, cooperative and appears stated age Head: Normocephalic, without obvious abnormality, atraumatic Neck: no adenopathy, supple, symmetrical, trachea midline and thyroid normal to inspection and palpation Lungs: clear to auscultation bilaterally Breasts: normal appearance, no masses or tenderness Heart: regular rate and rhythm Abdomen: soft, non-tender; no masses,  no organomegaly Extremities: extremities normal, atraumatic, no cyanosis or edema Skin: Skin color, texture, turgor normal. No rashes or lesions, right leg lesion that is raised and firm under the skin - no exudate. Does not look like a typical sebaceous cyst Lymph nodes: Cervical, supraclavicular, and axillary nodes  normal. No abnormal inguinal nodes palpated Neurologic: Grossly normal   Pelvic: External genitalia:  no lesions              Urethra:  normal appearing urethra with no masses, tenderness or lesions              Bartholin's and Skene's: normal                 Vagina: normal appearing vagina with normal color and discharge, no lesions              Cervix: anteverted              Pap taken: No. Bimanual Exam:  Uterus:  normal size, contour, position, consistency, mobility, non-tender              Adnexa: no mass, fullness, tenderness               Rectovaginal: Confirms               Anus:  normal sphincter tone, no lesions  Chaperone present: no  A:  Well Woman with normal exam  Premature Menopause on HRT age 31 HRT from 7/92- 2004, restarted 7/05 for short time. History of anxiety  Vit D deficiency Weight loss and has maintained her goal  Lesion right upper leg going to see Dermatologist   P:   Reviewed health and wellness pertinent to exam  Pap smear as above  Mammogram is due and scheduled 07/04/15  Will schedule colonoscopy  Will follow with labs  Counseled on breast self exam, mammography screening, adequate intake of calcium and vitamin D, diet and exercise return annually or prn  An After Visit Summary was printed and given to the patient.

## 2015-06-01 LAB — VITAMIN D 25 HYDROXY (VIT D DEFICIENCY, FRACTURES): Vit D, 25-Hydroxy: 43 ng/mL (ref 30–100)

## 2015-06-01 LAB — HIV ANTIBODY (ROUTINE TESTING W REFLEX): HIV: NONREACTIVE

## 2015-06-01 LAB — HEPATITIS C ANTIBODY: HCV AB: NEGATIVE

## 2015-06-02 LAB — HEMOGLOBIN, FINGERSTICK: HEMOGLOBIN, FINGERSTICK: 13.8 g/dL (ref 12.0–16.0)

## 2015-07-04 ENCOUNTER — Ambulatory Visit
Admission: RE | Admit: 2015-07-04 | Discharge: 2015-07-04 | Disposition: A | Discharge status: Home / Self Care | Payer: BLUE CROSS/BLUE SHIELD | Source: Ambulatory Visit

## 2015-07-04 DIAGNOSIS — Z1231 Encounter for screening mammogram for malignant neoplasm of breast: Secondary | ICD-10-CM

## 2015-08-03 ENCOUNTER — Encounter: Payer: Self-pay | Admitting: Gastroenterology

## 2015-09-23 ENCOUNTER — Ambulatory Visit (AMBULATORY_SURGERY_CENTER): Payer: Self-pay | Admitting: *Deleted

## 2015-09-23 VITALS — Ht 69.0 in | Wt 188.8 lb

## 2015-09-23 DIAGNOSIS — Z86010 Personal history of colonic polyps: Secondary | ICD-10-CM

## 2015-09-23 NOTE — Progress Notes (Signed)
No allergies to eggs or soy. No problems with anesthesia.  Pt given Emmi instructions for colonoscopy  No oxygen use  No diet drug use  Pt requested Miralax prep; says she has problems with other preps and can only do Miralax prep

## 2015-09-26 ENCOUNTER — Encounter: Payer: Self-pay | Admitting: Gastroenterology

## 2015-10-07 ENCOUNTER — Encounter: Payer: Self-pay | Admitting: Gastroenterology

## 2015-10-07 ENCOUNTER — Ambulatory Visit (AMBULATORY_SURGERY_CENTER): Payer: BLUE CROSS/BLUE SHIELD | Admitting: Gastroenterology

## 2015-10-07 VITALS — BP 149/83 | HR 70 | Temp 97.5°F | Resp 12 | Ht 69.0 in | Wt 188.0 lb

## 2015-10-07 DIAGNOSIS — Z86010 Personal history of colonic polyps: Secondary | ICD-10-CM

## 2015-10-07 DIAGNOSIS — Z860101 Personal history of adenomatous and serrated colon polyps: Secondary | ICD-10-CM

## 2015-10-07 DIAGNOSIS — D124 Benign neoplasm of descending colon: Secondary | ICD-10-CM | POA: Diagnosis not present

## 2015-10-07 HISTORY — DX: Personal history of colonic polyps: Z86.010

## 2015-10-07 HISTORY — DX: Personal history of adenomatous and serrated colon polyps: Z86.0101

## 2015-10-07 MED ORDER — SODIUM CHLORIDE 0.9 % IV SOLN: 500.0000 mL | INTRAVENOUS | Status: DC

## 2015-10-07 NOTE — Op Note (Signed)
[facility] Patient Name: Alexandria Mercado Procedure Date: 10/07/2015 1:59 PM MRN: [id number] Endoscopist: Mauri Pole , MD Age: 60 Referring MD:  Date of Birth: 09-16-1955 Gender: Female Account #: [id number] Procedure:                Colonoscopy Indications:              Screening for colorectal malignant neoplasm Medicines:                Monitored Anesthesia Care Procedure:                Pre-Anesthesia Assessment:                           - Prior to the procedure, a History and Physical                            was performed, and patient medications and                            allergies were reviewed. The patient's tolerance of                            previous anesthesia was also reviewed. The risks                            and benefits of the procedure and the sedation                            options and risks were discussed with the patient.                            All questions were answered, and informed consent                            was obtained. Prior Anticoagulants: The patient has                            taken no previous anticoagulant or antiplatelet                            agents. ASA Grade Assessment: II - A patient with                            mild systemic disease. After reviewing the risks                            and benefits, the patient was deemed in                            satisfactory condition to undergo the procedure.                           After obtaining informed consent, the colonoscope  was passed under direct vision. Throughout the                            procedure, the patient's blood pressure, pulse, and                            oxygen saturations were monitored continuously. The                            Model CF-HQ190L [id number]) scope was introduced                            through the anus and advanced to the the terminal                            ileum, with  identification of the appendiceal                            orifice and IC valve. The quality of the bowel                            preparation was good. No anatomical landmarks were                            photographed. Scope In: 8:09:20 AM Scope Out: 8:28:36 AM Total Procedure Duration: 0 hours 19 minutes 16 seconds  Findings:                 The perianal and digital rectal examinations were                            normal.                           Three sessile polyps were found in the descending                            colon. The polyps were 4 to 7 mm in size. These                            polyps were removed with a cold snare. Resection                            and retrieval were complete.                           A 3 mm polyp was found in the descending colon. The                            polyp was sessile. The polyp was removed with a                            cold biopsy forceps. Resection and retrieval were  complete.                           Non-bleeding internal hemorrhoids were found during                            retroflexion. The hemorrhoids were small. Complications:            No immediate complications. Estimated Blood Loss:     Estimated blood loss was minimal. Impression:               - Three 4 to 7 mm polyps in the descending colon,                            removed with a cold snare. Resected and retrieved.                           - One 3 mm polyp in the descending colon, removed                            with a cold biopsy forceps. Resected and retrieved.                           - Non-bleeding internal hemorrhoids. Recommendation:           - Patient has a contact number available for                            emergencies. The signs and symptoms of potential                            delayed complications were discussed with the                            patient. Return to normal activities tomorrow.                             Written discharge instructions were provided to the                            patient.                           - Resume previous diet.                           - Continue present medications.                           - Await pathology results.                           - Repeat colonoscopy in 3 - 5 years for                            surveillance. Mauri Pole, MD 10/07/2015 4:16:44 PM This report has been  signed electronically.

## 2015-10-07 NOTE — Progress Notes (Signed)
Stable to RR 

## 2015-10-07 NOTE — Patient Instructions (Signed)
YOU HAD AN ENDOSCOPIC PROCEDURE TODAY AT THE [facility]:   Refer to the procedure report that was given to you for any specific questions about what was found during the examination.  If the procedure report does not answer your questions, please call your gastroenterologist to clarify.  If you requested that your care partner not be given the details of your procedure findings, then the procedure report has been included in a sealed envelope for you to review at your convenience later.  YOU SHOULD EXPECT: Some feelings of bloating in the abdomen. Passage of more gas than usual.  Walking can help get rid of the air that was put into your GI tract during the procedure and reduce the bloating. If you had a lower endoscopy (such as a colonoscopy or flexible sigmoidoscopy) you may notice spotting of blood in your stool or on the toilet paper. If you underwent a bowel prep for your procedure, you may not have a normal bowel movement for a few days.  Please Note:  You might notice some irritation and congestion in your nose or some drainage.  This is from the oxygen used during your procedure.  There is no need for concern and it should clear up in a day or so.  SYMPTOMS TO REPORT IMMEDIATELY:   Following lower endoscopy (colonoscopy or flexible sigmoidoscopy):  Excessive amounts of blood in the stool  Significant tenderness or worsening of abdominal pains  Swelling of the abdomen that is new, acute  Fever of 100F or higher   For urgent or emergent issues, a gastroenterologist can be reached at any hour by calling [phone].   DIET: Your first meal following the procedure should be a small meal and then it is ok to progress to your normal diet. Heavy or fried foods are harder to digest and may make you feel nauseous or bloated.  Likewise, meals heavy in dairy and vegetables can increase bloating.  Drink plenty of fluids but you should avoid alcoholic beverages for 24  hours.  ACTIVITY:  You should plan to take it easy for the rest of today and you should NOT DRIVE or use heavy machinery until tomorrow (because of the sedation medicines used during the test).    FOLLOW UP: Our staff will call the number listed on your records the next business day following your procedure to check on you and address any questions or concerns that you may have regarding the information given to you following your procedure. If we do not reach you, we will leave a message.  However, if you are feeling well and you are not experiencing any problems, there is no need to return our call.  We will assume that you have returned to your regular daily activities without incident.  If any biopsies were taken you will be contacted by phone or by letter within the next 1-3 weeks.  Please call us at [phone] if you have not heard about the biopsies in 3 weeks.    SIGNATURES/CONFIDENTIALITY: You and/or your care partner have signed paperwork which will be entered into your electronic medical record.  These signatures attest to the fact that that the information above on your After Visit Summary has been reviewed and is understood.  Full responsibility of the confidentiality of this discharge information lies with you and/or your care-partner.  Polyp, diverticulosis, high fiber diet, and hemorrhoid information given. 

## 2015-10-07 NOTE — Progress Notes (Signed)
Called to room to assist during endoscopic procedure.  Patient ID and intended procedure confirmed with present staff. Received instructions for my participation in the procedure from the performing physician.  

## 2015-10-10 ENCOUNTER — Telehealth: Payer: Self-pay

## 2015-10-10 NOTE — Telephone Encounter (Signed)
  Follow up Call-  Call back number 10/07/2015  Post procedure Call Back phone  # [phone]  Permission to leave phone message Yes  Some recent data might be hidden     Patient questions:  Do you have a fever, pain , or abdominal swelling? No. Pain Score  0 *  Have you tolerated food without any problems? Yes.    Have you been able to return to your normal activities? Yes.    Do you have any questions about your discharge instructions: Diet   No. Medications  No. Follow up visit  No.  Do you have questions or concerns about your Care? No.  Actions: * If pain score is 4 or above: No action needed, pain <4.

## 2015-10-14 ENCOUNTER — Encounter: Payer: Self-pay | Admitting: Gastroenterology

## 2015-10-19 ENCOUNTER — Telehealth: Payer: Self-pay | Admitting: Gastroenterology

## 2015-10-19 NOTE — Telephone Encounter (Signed)
Unable to view her pathology results through My Chart. Copy mailed to her. Results reviewed with her.

## 2015-11-08 ENCOUNTER — Telehealth: Payer: Self-pay | Admitting: Gastroenterology

## 2015-11-08 ENCOUNTER — Other Ambulatory Visit: Payer: Self-pay

## 2015-11-08 MED ORDER — HYDROCORTISONE ACETATE 25 MG RE SUPP: 25.0000 mg | Freq: Two times a day (BID) | RECTAL | 1 refills | Status: DC

## 2015-11-08 NOTE — Telephone Encounter (Signed)
Patient is advised. Uses [facility] on [address]

## 2015-11-08 NOTE — Telephone Encounter (Signed)
Spoke with the patient. She started having bleeding with her bowel movement Sunday 11/06/15. She denies any pain or burning. No syncope or near syncope. She sees blood with the bowel movement and when she wipes. A colonoscopy for screening purposes was done 10/07/15. She was noted to have internal hemorrhoids. What should she do?

## 2015-11-08 NOTE — Telephone Encounter (Signed)
Likely etiology of bright red blood per rectum is small volume hemorrhoidal hemorrhage. Please advise patient to use Anusol per rectum twice daily for next 5 days and schedule for hemorrhoidal banding session.

## 2015-12-01 ENCOUNTER — Encounter: Payer: BLUE CROSS/BLUE SHIELD | Admitting: Gastroenterology

## 2015-12-06 NOTE — Telephone Encounter (Signed)
Erroneous encounter

## 2016-04-18 DIAGNOSIS — M720 Palmar fascial fibromatosis [Dupuytren]: Secondary | ICD-10-CM | POA: Insufficient documentation

## 2016-04-18 DIAGNOSIS — M79641 Pain in right hand: Secondary | ICD-10-CM | POA: Insufficient documentation

## 2016-04-18 DIAGNOSIS — M79642 Pain in left hand: Secondary | ICD-10-CM

## 2016-04-18 DIAGNOSIS — M65342 Trigger finger, left ring finger: Secondary | ICD-10-CM | POA: Insufficient documentation

## 2016-06-08 ENCOUNTER — Encounter: Payer: Self-pay | Admitting: Nurse Practitioner

## 2016-06-08 ENCOUNTER — Other Ambulatory Visit: Payer: Self-pay | Admitting: Family Medicine

## 2016-06-08 ENCOUNTER — Ambulatory Visit (INDEPENDENT_AMBULATORY_CARE_PROVIDER_SITE_OTHER): Payer: BLUE CROSS/BLUE SHIELD | Admitting: Nurse Practitioner

## 2016-06-08 VITALS — BP 138/98 | HR 68 | Resp 16 | Ht 68.0 in | Wt 216.0 lb

## 2016-06-08 DIAGNOSIS — E2839 Other primary ovarian failure: Secondary | ICD-10-CM

## 2016-06-08 DIAGNOSIS — Z1231 Encounter for screening mammogram for malignant neoplasm of breast: Secondary | ICD-10-CM

## 2016-06-08 DIAGNOSIS — F411 Generalized anxiety disorder: Secondary | ICD-10-CM | POA: Diagnosis not present

## 2016-06-08 DIAGNOSIS — Z01411 Encounter for gynecological examination (general) (routine) with abnormal findings: Secondary | ICD-10-CM

## 2016-06-08 DIAGNOSIS — E559 Vitamin D deficiency, unspecified: Secondary | ICD-10-CM

## 2016-06-08 DIAGNOSIS — Z0000 Encounter for general adult medical examination without abnormal findings: Secondary | ICD-10-CM

## 2016-06-08 DIAGNOSIS — E288 Other ovarian dysfunction: Secondary | ICD-10-CM | POA: Diagnosis not present

## 2016-06-08 NOTE — Progress Notes (Signed)
61 y.o. G0P0 Married  Caucasian Fe here for annual exam.  Last  Year had upper thigh benign tumor.    Patient's last menstrual period was 09/17/1990 (approximate).          Sexually active: No.  The current method of family planning is post menopausal status.    Exercising: Yes.    gym 3x/week; walking, bike riding Smoker:  no  Health Maintenance: Pap:  05/26/14, Negative with neg HR HPV  10-30-11 neg HPV HR neg MMG:  07/04/15 BIRADS 1 negative Colonoscopy:  October 07 2015; tubular adenoma polys; f/u 3 years BMD:   06/29/14 T Score, -1.5 Spine / -1.1 Right Femur Neck / -0.6 Left Femur Neck TDaP:  04/25/09 Shingles: 11/2013 Pneumonia: never Hep C and HIV: 05/31/15 Labs: Dr. Inda Merlin   reports that she quit smoking about 16 years ago. She has never used smokeless tobacco. She reports that she does not drink alcohol or use drugs.  Past Medical History:  Diagnosis Date  . Amenorrhea   . Anovulation 8/91  . Anxiety   . Chronic RLQ pain 1989   secondary to adhesions  . Depression    grief counseling  . Diverticulosis of colon (without mention of hemorrhage)   . Osteoporosis   . Premature menopause age 5   On HRT age 75 - 2007  . Smoker     Past Surgical History:  Procedure Laterality Date  . CHEILECTOMY Right 9/06   great toe   . COLONOSCOPY  1988   normal  . COLONOSCOPY  9/98   normal  . COLONOSCOPY  11/03   normal  . COLONOSCOPY W/ BIOPSIES  03/2008   mild diverticula - recheck 7 years  . HYSTEROSCOPY  2003  . LAPAROSCOPIC OVARIAN CYSTECTOMY Right 1985  . MM BREAST STEREO BX*L*R/S Right 06/2006    bloody nipple discharge - path reveals intraductal papilloma Dr. Annamaria Boots    Current Outpatient Prescriptions  Medication Sig Dispense Refill  . ALPRAZolam (XANAX) 0.5 MG tablet Take 0.5 mg by mouth every 8 (eight) hours as needed. for anxiety  1  . Calcium Carb-Cholecalciferol (CALTRATE 600+D) 600-800 MG-UNIT TABS Take 1 tablet by mouth daily.    . cholecalciferol (VITAMIN D) 1000  UNITS tablet Take 1,000 Units by mouth daily.    . cyclobenzaprine (FLEXERIL) 10 MG tablet as needed. Reported on 09/23/2015  1  . Magnesium 250 MG TABS Take by mouth daily.    . Multiple Vitamin (MULTIVITAMIN) tablet Take 1 tablet by mouth daily.    . vitamin C (ASCORBIC ACID) 500 MG tablet Take 500 mg by mouth daily. Reported on 09/23/2015     No current facility-administered medications for this visit.     Family History  Problem Relation Age of Onset  . Colon cancer Maternal Uncle 54  . Uterine cancer Mother   . Hyperlipidemia Mother     ROS:  Pertinent items are noted in HPI.  Otherwise, a comprehensive ROS was negative.  Exam:   BP (!) 146/90 (BP Location: Right Arm, Patient Position: Sitting, Cuff Size: Normal)   Pulse 68   Resp 16   Ht 5\' 8"  (1.727 m)   Wt 216 lb (98 kg)   LMP 09/17/1990 (Approximate)   BMI 32.84 kg/m  Height: 5\' 8"  (172.7 cm) Ht Readings from Last 3 Encounters:  06/08/16 5\' 8"  (1.727 m)  10/07/15 5\' 9"  (1.753 m)  09/23/15 5\' 9"  (1.753 m)    General appearance: alert, cooperative and appears stated age  Head: Normocephalic, without obvious abnormality, atraumatic Neck: no adenopathy, supple, symmetrical, trachea midline and thyroid normal to inspection and palpation Lungs: clear to auscultation bilaterally Breasts: normal appearance, no masses or tenderness Heart: regular rate and rhythm Abdomen: soft, non-tender; no masses,  no organomegaly Extremities: extremities normal, atraumatic, no cyanosis or edema Skin: Skin color, texture, turgor normal. No rashes or lesions Lymph nodes: Cervical, supraclavicular, and axillary nodes normal. No abnormal inguinal nodes palpated Neurologic: Grossly normal   Pelvic: External genitalia:  no lesions              Urethra:  normal appearing urethra with no masses, tenderness or lesions              Bartholin's and Skene's: normal                 Vagina: normal appearing vagina with normal color and discharge, no  lesions              Cervix: anteverted              Pap taken: No. Bimanual Exam:  Uterus:  normal size, contour, position, consistency, mobility, non-tender              Adnexa: no mass, fullness, tenderness               Rectovaginal: Confirms               Anus:  normal sphincter tone, no lesions  Chaperone present: no  A:  Well Woman with normal exam   Premature Menopause on HRT age 27 HRT from 7/92- 2004, restarted 7/05 for short time. History of anxiety  Vit D deficiency Weight loss several yrs ago with wt gain this year             Lesion right upper leg and saw Dermatologist - benign lesion   P:   Reviewed health and wellness pertinent to exam  Pap smear as above  Mammogram is due 06/2016  Will get derm notes from last year  Will try again for wt loss  Labs are to be done at PCP  Counseled on breast self exam, mammography screening, adequate intake of calcium and vitamin D, diet and exercise, Kegel's exercises return annually or prn  An After Visit Summary was printed and given to the patient.

## 2016-06-08 NOTE — Patient Instructions (Addendum)

## 2016-06-11 NOTE — Progress Notes (Signed)
Encounter reviewed by Dr. Brook Amundson C. Silva.  

## 2016-07-04 ENCOUNTER — Ambulatory Visit
Admission: RE | Admit: 2016-07-04 | Discharge: 2016-07-04 | Disposition: A | Discharge status: Home / Self Care | Payer: BLUE CROSS/BLUE SHIELD | Source: Ambulatory Visit | Attending: Family Medicine | Admitting: Family Medicine

## 2016-07-04 DIAGNOSIS — Z1231 Encounter for screening mammogram for malignant neoplasm of breast: Secondary | ICD-10-CM

## 2016-08-31 ENCOUNTER — Other Ambulatory Visit: Payer: Self-pay | Admitting: Orthopedic Surgery

## 2016-08-31 ENCOUNTER — Encounter (HOSPITAL_BASED_OUTPATIENT_CLINIC_OR_DEPARTMENT_OTHER): Payer: Self-pay | Admitting: *Deleted

## 2016-08-31 DIAGNOSIS — M65311 Trigger thumb, right thumb: Secondary | ICD-10-CM | POA: Insufficient documentation

## 2016-09-03 ENCOUNTER — Encounter (HOSPITAL_BASED_OUTPATIENT_CLINIC_OR_DEPARTMENT_OTHER): Payer: Self-pay | Admitting: Anesthesiology

## 2016-09-04 ENCOUNTER — Ambulatory Visit (HOSPITAL_BASED_OUTPATIENT_CLINIC_OR_DEPARTMENT_OTHER)
Admission: RE | Admit: 2016-09-04 | Payer: BLUE CROSS/BLUE SHIELD | Source: Ambulatory Visit | Admitting: Orthopedic Surgery

## 2016-09-04 HISTORY — DX: Other specified postprocedural states: R11.2

## 2016-09-04 HISTORY — DX: Other specified postprocedural states: Z98.890

## 2016-09-04 SURGERY — RELEASE, A1 PULLEY, FOR TRIGGER FINGER
Anesthesia: Regional | Laterality: Right

## 2016-09-04 MED ORDER — BUPIVACAINE HCL (PF) 0.25 % IJ SOLN
INTRAMUSCULAR | Status: AC
  Filled 2016-09-04: qty 30

## 2016-09-06 ENCOUNTER — Other Ambulatory Visit: Payer: Self-pay | Admitting: Orthopedic Surgery

## 2016-09-14 ENCOUNTER — Encounter (HOSPITAL_BASED_OUTPATIENT_CLINIC_OR_DEPARTMENT_OTHER): Payer: Self-pay | Admitting: *Deleted

## 2016-09-16 DIAGNOSIS — M65311 Trigger thumb, right thumb: Secondary | ICD-10-CM

## 2016-09-16 HISTORY — DX: Trigger thumb, right thumb: M65.311

## 2016-09-18 ENCOUNTER — Encounter (HOSPITAL_BASED_OUTPATIENT_CLINIC_OR_DEPARTMENT_OTHER)
Admission: RE | Disposition: A | Discharge status: Home / Self Care | Payer: Self-pay | Source: Ambulatory Visit | Attending: Orthopedic Surgery

## 2016-09-18 ENCOUNTER — Encounter (HOSPITAL_BASED_OUTPATIENT_CLINIC_OR_DEPARTMENT_OTHER): Payer: Self-pay | Admitting: *Deleted

## 2016-09-18 ENCOUNTER — Ambulatory Visit (HOSPITAL_BASED_OUTPATIENT_CLINIC_OR_DEPARTMENT_OTHER): Payer: BLUE CROSS/BLUE SHIELD | Admitting: Anesthesiology

## 2016-09-18 ENCOUNTER — Ambulatory Visit (HOSPITAL_BASED_OUTPATIENT_CLINIC_OR_DEPARTMENT_OTHER)
Admission: RE | Admit: 2016-09-18 | Discharge: 2016-09-18 | Disposition: A | Discharge status: Home / Self Care | Payer: BLUE CROSS/BLUE SHIELD | Source: Ambulatory Visit | Attending: Orthopedic Surgery | Admitting: Orthopedic Surgery

## 2016-09-18 DIAGNOSIS — M65311 Trigger thumb, right thumb: Secondary | ICD-10-CM | POA: Diagnosis not present

## 2016-09-18 DIAGNOSIS — F419 Anxiety disorder, unspecified: Secondary | ICD-10-CM | POA: Diagnosis not present

## 2016-09-18 DIAGNOSIS — Z87891 Personal history of nicotine dependence: Secondary | ICD-10-CM | POA: Insufficient documentation

## 2016-09-18 DIAGNOSIS — Z881 Allergy status to other antibiotic agents status: Secondary | ICD-10-CM | POA: Insufficient documentation

## 2016-09-18 DIAGNOSIS — Z888 Allergy status to other drugs, medicaments and biological substances status: Secondary | ICD-10-CM | POA: Insufficient documentation

## 2016-09-18 DIAGNOSIS — Z8719 Personal history of other diseases of the digestive system: Secondary | ICD-10-CM | POA: Insufficient documentation

## 2016-09-18 DIAGNOSIS — Z885 Allergy status to narcotic agent status: Secondary | ICD-10-CM | POA: Insufficient documentation

## 2016-09-18 DIAGNOSIS — Z79899 Other long term (current) drug therapy: Secondary | ICD-10-CM | POA: Insufficient documentation

## 2016-09-18 DIAGNOSIS — Z880 Allergy status to penicillin: Secondary | ICD-10-CM | POA: Diagnosis not present

## 2016-09-18 DIAGNOSIS — M810 Age-related osteoporosis without current pathological fracture: Secondary | ICD-10-CM | POA: Diagnosis not present

## 2016-09-18 DIAGNOSIS — M65841 Other synovitis and tenosynovitis, right hand: Secondary | ICD-10-CM | POA: Insufficient documentation

## 2016-09-18 DIAGNOSIS — F329 Major depressive disorder, single episode, unspecified: Secondary | ICD-10-CM | POA: Diagnosis not present

## 2016-09-18 HISTORY — PX: TRIGGER FINGER RELEASE: SHX641

## 2016-09-18 SURGERY — RELEASE, A1 PULLEY, FOR TRIGGER FINGER
Anesthesia: Monitor Anesthesia Care | Site: Thumb | Laterality: Right

## 2016-09-18 MED ORDER — CHLORHEXIDINE GLUCONATE 4 % EX LIQD: 60.0000 mL | Freq: Once | CUTANEOUS | Status: DC

## 2016-09-18 MED ORDER — MIDAZOLAM HCL 2 MG/2ML IJ SOLN
INTRAMUSCULAR | Status: AC
  Filled 2016-09-18: qty 2

## 2016-09-18 MED ORDER — ONDANSETRON HCL 4 MG/2ML IJ SOLN
INTRAMUSCULAR | Status: AC
  Filled 2016-09-18: qty 2

## 2016-09-18 MED ORDER — MEPERIDINE HCL 25 MG/ML IJ SOLN: 6.2500 mg | INTRAMUSCULAR | Status: DC | PRN

## 2016-09-18 MED ORDER — ONDANSETRON HCL 4 MG/2ML IJ SOLN
4.0000 mg | Freq: Once | INTRAMUSCULAR | Status: AC | PRN
  Administered 2016-09-18: 4 mg via INTRAVENOUS

## 2016-09-18 MED ORDER — LIDOCAINE HCL (PF) 0.5 % IJ SOLN
INTRAMUSCULAR | Status: DC | PRN
  Administered 2016-09-18: 30 mL via INTRAVENOUS

## 2016-09-18 MED ORDER — BUPIVACAINE HCL (PF) 0.25 % IJ SOLN
INTRAMUSCULAR | Status: DC | PRN
  Administered 2016-09-18: 7 mL

## 2016-09-18 MED ORDER — LACTATED RINGERS IV SOLN
INTRAVENOUS | Status: DC
  Administered 2016-09-18 (×2): via INTRAVENOUS

## 2016-09-18 MED ORDER — TRAMADOL HCL 50 MG PO TABS: 50.0000 mg | ORAL_TABLET | Freq: Four times a day (QID) | ORAL | 0 refills | Status: DC | PRN

## 2016-09-18 MED ORDER — SCOPOLAMINE 1 MG/3DAYS TD PT72: 1.0000 | MEDICATED_PATCH | Freq: Once | TRANSDERMAL | Status: DC | PRN

## 2016-09-18 MED ORDER — PROPOFOL 500 MG/50ML IV EMUL
INTRAVENOUS | Status: AC
  Filled 2016-09-18: qty 50

## 2016-09-18 MED ORDER — FENTANYL CITRATE (PF) 100 MCG/2ML IJ SOLN
50.0000 ug | INTRAMUSCULAR | Status: DC | PRN
  Administered 2016-09-18 (×2): 50 ug via INTRAVENOUS

## 2016-09-18 MED ORDER — FENTANYL CITRATE (PF) 100 MCG/2ML IJ SOLN
INTRAMUSCULAR | Status: AC
  Filled 2016-09-18: qty 2

## 2016-09-18 MED ORDER — MIDAZOLAM HCL 2 MG/2ML IJ SOLN
1.0000 mg | INTRAMUSCULAR | Status: DC | PRN
  Administered 2016-09-18 (×2): 1 mg via INTRAVENOUS

## 2016-09-18 MED ORDER — VANCOMYCIN HCL IN DEXTROSE 1-5 GM/200ML-% IV SOLN
INTRAVENOUS | Status: AC
  Filled 2016-09-18: qty 200

## 2016-09-18 MED ORDER — VANCOMYCIN HCL IN DEXTROSE 1-5 GM/200ML-% IV SOLN
1000.0000 mg | INTRAVENOUS | Status: AC
  Administered 2016-09-18: 1000 mg via INTRAVENOUS

## 2016-09-18 MED ORDER — PROPOFOL 500 MG/50ML IV EMUL
INTRAVENOUS | Status: DC | PRN
  Administered 2016-09-18: 75 ug/kg/min via INTRAVENOUS

## 2016-09-18 MED ORDER — FENTANYL CITRATE (PF) 100 MCG/2ML IJ SOLN: 25.0000 ug | INTRAMUSCULAR | Status: DC | PRN

## 2016-09-18 SURGICAL SUPPLY — 31 items
BANDAGE COBAN STERILE 2 (GAUZE/BANDAGES/DRESSINGS) ×2 IMPLANT
BLADE SURG 15 STRL LF DISP TIS (BLADE) ×1 IMPLANT
BLADE SURG 15 STRL SS (BLADE) ×2
BNDG CMPR 9X4 STRL LF SNTH (GAUZE/BANDAGES/DRESSINGS)
BNDG ESMARK 4X9 LF (GAUZE/BANDAGES/DRESSINGS) IMPLANT
CHLORAPREP W/TINT 26ML (MISCELLANEOUS) ×2 IMPLANT
CORD BIPOLAR FORCEPS 12FT (ELECTRODE) IMPLANT
COVER BACK TABLE 60X90IN (DRAPES) ×2 IMPLANT
COVER MAYO STAND STRL (DRAPES) ×2 IMPLANT
CUFF TOURNIQUET SINGLE 18IN (TOURNIQUET CUFF) ×2 IMPLANT
DECANTER SPIKE VIAL GLASS SM (MISCELLANEOUS) IMPLANT
DRAPE EXTREMITY T 121X128X90 (DRAPE) ×2 IMPLANT
DRAPE SURG 17X23 STRL (DRAPES) ×2 IMPLANT
GAUZE SPONGE 4X4 12PLY STRL (GAUZE/BANDAGES/DRESSINGS) ×2 IMPLANT
GAUZE XEROFORM 1X8 LF (GAUZE/BANDAGES/DRESSINGS) ×2 IMPLANT
GLOVE BIO SURGEON STRL SZ 6.5 (GLOVE) ×6 IMPLANT
GLOVE BIOGEL PI IND STRL 8.5 (GLOVE) ×1 IMPLANT
GLOVE BIOGEL PI INDICATOR 8.5 (GLOVE) ×1
GLOVE SURG ORTHO 8.0 STRL STRW (GLOVE) ×2 IMPLANT
GOWN STRL REUS W/ TWL LRG LVL3 (GOWN DISPOSABLE) ×1 IMPLANT
GOWN STRL REUS W/TWL LRG LVL3 (GOWN DISPOSABLE) ×2
GOWN STRL REUS W/TWL XL LVL3 (GOWN DISPOSABLE) ×2 IMPLANT
NEEDLE PRECISIONGLIDE 27X1.5 (NEEDLE) ×4 IMPLANT
NS IRRIG 1000ML POUR BTL (IV SOLUTION) ×2 IMPLANT
PACK BASIN DAY SURGERY FS (CUSTOM PROCEDURE TRAY) ×2 IMPLANT
STOCKINETTE 4X48 STRL (DRAPES) ×2 IMPLANT
SUT ETHILON 4 0 PS 2 18 (SUTURE) ×2 IMPLANT
SYR BULB 3OZ (MISCELLANEOUS) ×2 IMPLANT
SYR CONTROL 10ML LL (SYRINGE) ×2 IMPLANT
TOWEL OR 17X24 6PK STRL BLUE (TOWEL DISPOSABLE) ×4 IMPLANT
UNDERPAD 30X30 (UNDERPADS AND DIAPERS) ×2 IMPLANT

## 2016-09-18 NOTE — Op Note (Signed)
ictation Number [id number]

## 2016-09-18 NOTE — H&P (Signed)
Alexandria Mercado is an 61 y.o. female.   Chief Complaint: catching right thumb HPI: Alexandria Mercado is a 61 year old right-hand-dominant female comes in with a complaint of pain in her right thumb and left ring finger both the metacarpal phalangeal joint. This been going on for 2-3 weeks. She recalls no history of injury. She states that when it hurts it is a sharp pain with a VAS score 6/10. She states worse in the morning and improves during the day. She also has a nodule present at the metacarpal phalangeal joint to the ring finger on her left side. She has been using a brace and Aleve without relief. She has no history diabetes thyroid problems arthritis gout. Family history is negative for diabetes thyroid problems arthritis but positive for gout. She has not complained of any numbness or tingling.She has had the thumb injected on 2 occasions. She continues to complain of pain to the thumb. She has not had one injection to the ring finger. She has not complained of any discomfort on the ring finger. She states that the thumb began bothering her approximately 4 weeks ago          Past Medical History:  Diagnosis Date  . Amenorrhea   . Anovulation 8/91  . Anxiety   . Chronic RLQ pain 1989   secondary to adhesions  . Depression    grief counseling  . Diverticulosis of colon (without mention of hemorrhage)   . Osteoporosis   . PONV (postoperative nausea and vomiting)   . Premature menopause age 29   On HRT age 49 - 2007  . Smoker     Past Surgical History:  Procedure Laterality Date  . BREAST EXCISIONAL BIOPSY Right 06/11/2006  . CHEILECTOMY Right 9/06   great toe   . COLONOSCOPY  1988   normal  . COLONOSCOPY  9/98   normal  . COLONOSCOPY  11/03   normal  . COLONOSCOPY W/ BIOPSIES  03/2008   mild diverticula - recheck 7 years  . HYSTEROSCOPY  2003  . LAPAROSCOPIC OVARIAN CYSTECTOMY Right 1985  . MM BREAST STEREO BX*L*R/S Right 06/2006    bloody nipple discharge - path reveals  intraductal papilloma Dr. Annamaria Boots    Family History  Problem Relation Age of Onset  . Colon cancer Maternal Uncle 57  . Uterine cancer Mother   . Hyperlipidemia Mother   . Hypertension Brother   . Heart failure Maternal Grandfather   . Colon cancer Other    Social History:  reports that she quit smoking about 16 years ago. She has never used smokeless tobacco. She reports that she does not drink alcohol or use drugs.  Allergies:  Allergies  Allergen Reactions  . Amoxicillin-Pot Clavulanate     REACTION: rash  . Ceclor [Cefaclor]     REACTION: rash  . Codeine     REACTION: nausea  . Keflex [Cephalexin]     itching    Medications Prior to Admission  Medication Sig Dispense Refill  . ALPRAZolam (XANAX) 0.5 MG tablet Take 0.5 mg by mouth every 8 (eight) hours as needed. for anxiety  1  . Calcium Carb-Cholecalciferol (CALTRATE 600+D) 600-800 MG-UNIT TABS Take 1 tablet by mouth daily.    . cholecalciferol (VITAMIN D) 1000 UNITS tablet Take 1,000 Units by mouth daily.    . Magnesium 250 MG TABS Take by mouth daily.    . Multiple Vitamin (MULTIVITAMIN) tablet Take 1 tablet by mouth daily.    . vitamin C (ASCORBIC ACID)  500 MG tablet Take 500 mg by mouth daily. Reported on 09/23/2015      No results found for this or any previous visit (from the past 48 hour(s)).  No results found.   Pertinent items are noted in HPI.  Weight 98 kg (216 lb), last menstrual period 09/17/1990.  General appearance: alert, cooperative and appears stated age Head: Normocephalic, without obvious abnormality Neck: no JVD Resp: clear to auscultation bilaterally Cardio: regular rate and rhythm, S1, S2 normal, no murmur, click, rub or gallop GI: soft, non-tender; bowel sounds normal; no masses,  no organomegaly Extremities: catching right thumb Pulses: 2+ and symmetric Skin: Skin color, texture, turgor normal. No rashes or lesions Neurologic: Grossly  normal Incision/Wound: na  Assessment/Plan  Assessment:  1. Trigger finger of right thumb  2. Trigger ring finger of left hand    Plan: We have discussed possibility surgical decompression of the A1 pulley release of her trigger right thumb. Pre-peri-and postoperative course are discussed along with risks and complications. She is aware that there is no guarantee to the surgery the possibility of infection recurrence injury to arteries nerves tendons complete relief symptoms dystrophy. She would like to proceed she is scheduled for release A1 pulley right thumb as an outpatient under regional anesthesia. Questions are encouraged and answered          Alexandria Mercado R 09/18/2016, 9:16 AM

## 2016-09-18 NOTE — Anesthesia Postprocedure Evaluation (Signed)
Anesthesia Post Note  Patient: Alexandria Mercado  Procedure(s) Performed: Procedure(s) (LRB): RELEASE TRIGGER FINGER/A-1 PULLEY RIGHT THUMB (Right)     Patient location during evaluation: PACU Anesthesia Type: MAC Level of consciousness: awake and alert Pain management: pain level controlled Vital Signs Assessment: post-procedure vital signs reviewed and stable Respiratory status: spontaneous breathing, nonlabored ventilation, respiratory function stable and patient connected to nasal cannula oxygen Cardiovascular status: stable and blood pressure returned to baseline Anesthetic complications: no    Last Vitals:  Vitals:   09/18/16 1053 09/18/16 1100  BP: 139/76 (!) 146/78  Pulse: 84 79  Resp: 20 18  Temp: 36.8 C     Last Pain:  Vitals:   09/18/16 1110  TempSrc:   PainSc: 0-No pain                 Markesia Crilly

## 2016-09-18 NOTE — Discharge Instructions (Addendum)

## 2016-09-18 NOTE — Transfer of Care (Signed)
Immediate Anesthesia Transfer of Care Note  Patient: Alexandria Mercado  Procedure(s) Performed: Procedure(s) with comments: RELEASE TRIGGER FINGER/A-1 PULLEY RIGHT THUMB (Right) - REG/FAB  Patient Location: PACU  Anesthesia Type:Bier block  Level of Consciousness: awake, alert  and oriented  Airway & Oxygen Therapy: Patient Spontanous Breathing and Patient connected to face mask oxygen  Post-op Assessment: Report given to RN and Post -op Vital signs reviewed and stable  Post vital signs: Reviewed and stable  Last Vitals:  Vitals:   09/18/16 0915  BP: (!) 155/82  Pulse: 93  Resp: 20  Temp: 37 C    Last Pain:  Vitals:   09/18/16 0915  TempSrc: Oral         Complications: No apparent anesthesia complications

## 2016-09-18 NOTE — Anesthesia Procedure Notes (Signed)
Anesthesia Regional Block: Bier block (IV Regional)   Pre-Anesthetic Checklist: ,, timeout performed, Correct Patient, Correct Site, Correct Laterality, Correct Procedure,, site marked, surgical consent,, at surgeon's request Needles:  Injection technique: Single-shot  Needle Type: Other      Needle Gauge: 20     Additional Needles:   Procedures:,,,,,,, Esmarch exsanguination, single tourniquet utilized,  Narrative:  Start time: 09/18/2016 10:29 AM End time: 09/18/2016 10:30 AM  Performed by: Personally

## 2016-09-18 NOTE — Anesthesia Preprocedure Evaluation (Signed)
Anesthesia Evaluation  Patient identified by MRN, date of birth, ID band Patient awake    Reviewed: Allergy & Precautions, NPO status , Patient's Chart, lab work & pertinent test results  Airway Mallampati: II  TM Distance: >3 FB Neck ROM: Full    Dental no notable dental hx.    Pulmonary neg pulmonary ROS, former smoker,    Pulmonary exam normal breath sounds clear to auscultation       Cardiovascular negative cardio ROS Normal cardiovascular exam Rhythm:Regular Rate:Normal     Neuro/Psych negative neurological ROS  negative psych ROS   GI/Hepatic negative GI ROS, Neg liver ROS,   Endo/Other  negative endocrine ROS  Renal/GU negative Renal ROS  negative genitourinary   Musculoskeletal negative musculoskeletal ROS (+)   Abdominal   Peds negative pediatric ROS (+)  Hematology negative hematology ROS (+)   Anesthesia Other Findings   Reproductive/Obstetrics negative OB ROS                             Anesthesia Physical Anesthesia Plan  ASA: II  Anesthesia Plan: MAC and Bier Block   Post-op Pain Management:    Induction:   PONV Risk Score and Plan: 1 and Ondansetron and Dexamethasone  Airway Management Planned: Natural Airway and Nasal Cannula  Additional Equipment:   Intra-op Plan:   Post-operative Plan:   Informed Consent: I have reviewed the patients History and Physical, chart, labs and discussed the procedure including the risks, benefits and alternatives for the proposed anesthesia with the patient or authorized representative who has indicated his/her understanding and acceptance.   Dental advisory given  Plan Discussed with:   Anesthesia Plan Comments:         Anesthesia Quick Evaluation

## 2016-09-18 NOTE — Op Note (Signed)
NAME:  Alexandria Mercado, Alexandria Mercado NO.:  [id number]  MEDICAL RECORD NO.:  [id number]  LOCATION:                                 FACILITY:  PHYSICIAN:  Daryll Brod, M.D.            DATE OF BIRTH:  DATE OF PROCEDURE:  09/18/2016 DATE OF DISCHARGE:                              OPERATIVE REPORT   PREOPERATIVE DIAGNOSIS:  Stenosing tenosynovitis, right thumb.  POSTOPERATIVE DIAGNOSIS:  Stenosing tenosynovitis, right thumb.  OPERATION:  Release of A1 pulley, right thumb.  SURGEON:  Daryll Brod, M.D.  ASSISTANT:  None.  ANESTHESIA:  Forearm-based IV regional with local infiltration.  PLACE OF SURGERY:  [facility].  HISTORY:  The patient is a 61 year old female with a history of triggering of her right thumb.  This has not responded to conservative treatment including multiple injections.  She is elected to undergo surgical release of the A1 pulley.  Preoperative, perioperative, and postoperative courses were discussed along with risks and complications. She is aware that there is no guarantee to the surgery; the possibility of infection; recurrence of injury to arteries, nerves, tendons; incomplete relief of symptoms; and dystrophy.  In the preoperative area, the patient was seen, the extremity marked by both patient and surgeon. Antibiotic given.  PROCEDURE IN DETAIL:  The patient was brought to the operating room, where a forearm-based IV regional anesthetic was carried out without difficulty under the direction of the Anesthesia Department.  She was prepped using ChloraPrep in a supine position with the right arm free. A 3-minute dry time was allowed.  Time-out taken, confirming the patient and procedure.  A transverse incision was made over the A1 pulley of the left thumb after a local infiltration with 0.25% bupivacaine without epinephrine, approximately 3 mL was used.  The digital neurovascular bundles were identified and protected.  The A1 pulley was  identified. This was released on its radial aspect, care was taken to protect the oblique pulley.  The thumb was placed through a full range motion, no further triggering was noted.  The tenosynovial tissue proximally was separated with blunt dissection.  The wound was copiously irrigated with saline.  The skin was closed with interrupted 4-0 nylon sutures. Further infiltration with 0.25% bupivacaine without epinephrine was given.  For a total of approximately 6 to 7 mL.  A sterile compressive dressing with the fingers and thumb free was applied.  On deflation of the tourniquet, all fingers immediately pinked.  She was able to flex her fingers and noted no further triggering.  She was taken to the recovery room for observation in satisfactory condition.  She will be discharged to home to return to the [facility] in 1 week, on [facility].          ______________________________ Daryll Brod, M.D.     GK/MEDQ  D:  09/18/2016  T:  09/18/2016  Job:  [id number]

## 2016-09-18 NOTE — Brief Op Note (Signed)
09/18/2016  10:54 AM  PATIENT:  Alexandria Mercado  61 y.o. female  PRE-OPERATIVE DIAGNOSIS:  TRIGGER RIGHT THUMB  POST-OPERATIVE DIAGNOSIS:  TRIGGER RIGHT THUMB  PROCEDURE:  Procedure(s) with comments: RELEASE TRIGGER FINGER/A-1 PULLEY RIGHT THUMB (Right) - REG/FAB  SURGEON:  Surgeon(s) and Role:    * Daryll Brod, MD - Primary  PHYSICIAN ASSISTANT:   ASSISTANTS: none   ANESTHESIA:   local and regional  EBL:  Total I/O In: 600 [I.V.:600] Out: -   BLOOD ADMINISTERED:none  DRAINS: none   LOCAL MEDICATIONS USED:  BUPIVICAINE   SPECIMEN:  No Specimen  DISPOSITION OF SPECIMEN:  N/A  COUNTS:  YES  TOURNIQUET:   Total Tourniquet Time Documented: Forearm (Right) - 17 minutes Total: Forearm (Right) - 17 minutes   DICTATION: .Other Dictation: Dictation Number [id number]  PLAN OF CARE: Discharge to home after PACU  PATIENT DISPOSITION:  PACU - hemodynamically stable.

## 2016-09-20 ENCOUNTER — Encounter (HOSPITAL_BASED_OUTPATIENT_CLINIC_OR_DEPARTMENT_OTHER): Payer: Self-pay | Admitting: Orthopedic Surgery

## 2016-10-23 ENCOUNTER — Telehealth: Payer: Self-pay | Admitting: Obstetrics & Gynecology

## 2016-10-23 NOTE — Telephone Encounter (Signed)
Left patient a message to call back to reschedule a future appointment that was cancelled by the provider. °

## 2016-11-08 ENCOUNTER — Ambulatory Visit (HOSPITAL_COMMUNITY)
Admission: EM | Admit: 2016-11-08 | Discharge: 2016-11-08 | Disposition: A | Discharge status: Other Acute Inpatient Hospital | Payer: BLUE CROSS/BLUE SHIELD | Attending: Internal Medicine | Admitting: Internal Medicine

## 2016-11-08 ENCOUNTER — Encounter (HOSPITAL_COMMUNITY): Payer: Self-pay | Admitting: Emergency Medicine

## 2016-11-08 ENCOUNTER — Telehealth: Payer: Self-pay

## 2016-11-08 ENCOUNTER — Ambulatory Visit (HOSPITAL_COMMUNITY): Admit: 2016-11-08 | Payer: BLUE CROSS/BLUE SHIELD | Admitting: Internal Medicine

## 2016-11-08 ENCOUNTER — Encounter (HOSPITAL_COMMUNITY): Admission: EM | Payer: Self-pay | Source: Home / Self Care

## 2016-11-08 ENCOUNTER — Ambulatory Visit (HOSPITAL_COMMUNITY)
Admission: RE | Admit: 2016-11-08 | Payer: BLUE CROSS/BLUE SHIELD | Source: Ambulatory Visit | Admitting: Internal Medicine

## 2016-11-08 DIAGNOSIS — K210 Gastro-esophageal reflux disease with esophagitis: Secondary | ICD-10-CM | POA: Diagnosis not present

## 2016-11-08 DIAGNOSIS — Z87891 Personal history of nicotine dependence: Secondary | ICD-10-CM | POA: Insufficient documentation

## 2016-11-08 DIAGNOSIS — F329 Major depressive disorder, single episode, unspecified: Secondary | ICD-10-CM | POA: Insufficient documentation

## 2016-11-08 DIAGNOSIS — K209 Esophagitis, unspecified: Secondary | ICD-10-CM

## 2016-11-08 DIAGNOSIS — T18108A Unspecified foreign body in esophagus causing other injury, initial encounter: Secondary | ICD-10-CM | POA: Diagnosis not present

## 2016-11-08 DIAGNOSIS — K219 Gastro-esophageal reflux disease without esophagitis: Secondary | ICD-10-CM | POA: Insufficient documentation

## 2016-11-08 DIAGNOSIS — F419 Anxiety disorder, unspecified: Secondary | ICD-10-CM | POA: Insufficient documentation

## 2016-11-08 DIAGNOSIS — Z79899 Other long term (current) drug therapy: Secondary | ICD-10-CM | POA: Diagnosis not present

## 2016-11-08 DIAGNOSIS — X58XXXA Exposure to other specified factors, initial encounter: Secondary | ICD-10-CM | POA: Insufficient documentation

## 2016-11-08 DIAGNOSIS — E28319 Asymptomatic premature menopause: Secondary | ICD-10-CM | POA: Insufficient documentation

## 2016-11-08 DIAGNOSIS — Z79890 Hormone replacement therapy (postmenopausal): Secondary | ICD-10-CM | POA: Diagnosis not present

## 2016-11-08 DIAGNOSIS — K222 Esophageal obstruction: Secondary | ICD-10-CM | POA: Diagnosis not present

## 2016-11-08 HISTORY — PX: ESOPHAGOGASTRODUODENOSCOPY: SHX5428

## 2016-11-08 SURGERY — EGD (ESOPHAGOGASTRODUODENOSCOPY)
Anesthesia: Moderate Sedation

## 2016-11-08 MED ORDER — DIPHENHYDRAMINE HCL 50 MG/ML IJ SOLN
INTRAMUSCULAR | Status: DC | PRN
  Administered 2016-11-08: 25 mg via INTRAVENOUS

## 2016-11-08 MED ORDER — ONDANSETRON HCL 4 MG/2ML IJ SOLN
4.0000 mg | Freq: Once | INTRAMUSCULAR | Status: AC
  Administered 2016-11-08: 4 mg via INTRAVENOUS

## 2016-11-08 MED ORDER — BUTAMBEN-TETRACAINE-BENZOCAINE 2-2-14 % EX AERO
INHALATION_SPRAY | CUTANEOUS | Status: DC | PRN
  Administered 2016-11-08: 1 via TOPICAL

## 2016-11-08 MED ORDER — ONDANSETRON HCL 4 MG/2ML IJ SOLN
INTRAMUSCULAR | Status: AC
  Filled 2016-11-08: qty 2

## 2016-11-08 MED ORDER — MIDAZOLAM HCL 10 MG/2ML IJ SOLN
INTRAMUSCULAR | Status: DC | PRN
  Administered 2016-11-08: 1 mg via INTRAVENOUS
  Administered 2016-11-08 (×2): 2 mg via INTRAVENOUS

## 2016-11-08 MED ORDER — FENTANYL CITRATE (PF) 100 MCG/2ML IJ SOLN
INTRAMUSCULAR | Status: DC | PRN
  Administered 2016-11-08 (×2): 25 ug via INTRAVENOUS

## 2016-11-08 MED ORDER — PANTOPRAZOLE SODIUM 40 MG PO TBEC: 40.0000 mg | DELAYED_RELEASE_TABLET | Freq: Every day | ORAL | 2 refills | Status: DC

## 2016-11-08 MED ORDER — DIPHENHYDRAMINE HCL 50 MG/ML IJ SOLN
INTRAMUSCULAR | Status: AC
  Filled 2016-11-08: qty 1

## 2016-11-08 MED ORDER — FENTANYL CITRATE (PF) 100 MCG/2ML IJ SOLN
INTRAMUSCULAR | Status: AC
  Filled 2016-11-08: qty 2

## 2016-11-08 MED ORDER — SODIUM CHLORIDE 0.9 % IV SOLN
INTRAVENOUS | Status: DC
  Administered 2016-11-08: 18:00:00 via INTRAVENOUS

## 2016-11-08 MED ORDER — MIDAZOLAM HCL 5 MG/ML IJ SOLN
INTRAMUSCULAR | Status: AC
  Filled 2016-11-08: qty 2

## 2016-11-08 NOTE — Op Note (Signed)
[facility] Patient Name: Alexandria Mercado Procedure Date: 11/08/2016 MRN: [id number] Attending MD: Gatha Mayer , MD Date of Birth: November 29, 1955 CSN: [id number] Age: 61 Admit Type: Emergency Department Procedure:                Upper GI endoscopy Indications:              Foreign body in the esophagus Providers:                Gatha Mayer, MD, Kingsley Plan, RN, Angus Seller, Tinnie Gens, Technician Referring MD:              Medicines:                Fentanyl 50 micrograms IV, Midazolam 5 mg IV,                            Diphenhydramine 25 mg IV, Ondansetron 4 mg IV,                            Cetacaine spray Complications:            No immediate complications. Estimated Blood Loss:     Estimated blood loss: none. Procedure:                Pre-Anesthesia Assessment:                           - Prior to the procedure, a History and Physical                            was performed, and patient medications and                            allergies were reviewed. The patient's tolerance of                            previous anesthesia was also reviewed. The risks                            and benefits of the procedure and the sedation                            options and risks were discussed with the patient.                            All questions were answered, and informed consent                            was obtained. Prior Anticoagulants: The patient has                            taken no previous anticoagulant or antiplatelet  agents. ASA Grade Assessment: II - A patient with                            mild systemic disease. After reviewing the risks                            and benefits, the patient was deemed in                            satisfactory condition to undergo the procedure.                           After obtaining informed consent, the endoscope was   passed under direct vision. Throughout the                            procedure, the patient's blood pressure, pulse, and                            oxygen saturations were monitored continuously. The                            [id number]) scope was introduced through the                            mouth, and advanced to the second part of duodenum.                            The upper GI endoscopy was accomplished without                            difficulty. The patient tolerated the procedure                            well. Scope In: Scope Out: Findings:      LA Grade A (one or more mucosal breaks less than 5 mm, not extending       between tops of 2 mucosal folds) esophagitis with no bleeding was found       in the distal esophagus.      A mild Schatzki ring (acquired) was found at the gastroesophageal       junction.      The exam was otherwise without abnormality.      The cardia and gastric fundus were normal on retroflexion. Impression:               - LA Grade A reflux esophagitis.                           - Mild Schatzki ring.                           - The examination was otherwise normal.                           - No specimens collected. Moderate Sedation:  Moderate (conscious) sedation was administered by the endoscopy nurse       and supervised by the endoscopist. The following parameters were       monitored: oxygen saturation, heart rate, blood pressure, and response       to care. Total physician intraservice time was 14 minutes. Recommendation:           - Patient has a contact number available for                            emergencies. The signs and symptoms of potential                            delayed complications were discussed with the                            patient. Return to normal activities tomorrow.                            Written discharge instructions were provided to the                            patient.                            - Resume previous diet.                           - Continue present medications.                           - She is to call office to arrange a follow-up                            appointment                           Start pantoprazole 40 mg qd - suspect she has GERD                            - may have been some rare intermittent dysphagia in                            past - could need dilation of ring. I suspect GERD                            though the erosion could have been related to                            earlier food impaction that passed or from                            vomiting. The symptoms began after potato chip                            ingestion -  so maybe there was trauma from that and                            a foreign body sensation?                           Globus is other possibility Procedure Code(s):        --- Professional ---                           [phone], Esophagogastroduodenoscopy, flexible,                            transoral; diagnostic, including collection of                            specimen(s) by brushing or washing, when performed                            (separate procedure) Diagnosis Code(s):        --- Professional ---                           K21.0, Gastro-esophageal reflux disease with                            esophagitis                           K22.2, Esophageal obstruction                           T18.108A, Unspecified foreign body in esophagus                            causing other injury, initial encounter CPT copyright 2016 American Medical Association. All rights reserved. The codes documented in this report are preliminary and upon coder review may  be revised to meet current compliance requirements. Gatha Mayer, MD 11/08/2016 6:44:26 PM This report has been signed electronically. Number of Addenda: 0

## 2016-11-08 NOTE — H&P (Signed)
[facility] History and Physical   Primary Care Physician:  Darcus Austin, MD   Reason for Procedure:   esophageal obstruction, foreign body/food impaction suspected  Plan:    Upper endoscopy, remove obstruction The risks and benefits as well as alternatives of endoscopic procedure(s) have been discussed and reviewed. All questions answered. The patient agrees to proceed.   HPI: Alexandria Mercado is a 61 y.o. female with onset of inability to swallow properly today - had a potato chip last night ? That impacted and foreign body sensation after - then unable to keep solid food or liquids down today. Foreign body sensation in the suprasternal area.   Past Medical History:  Diagnosis Date  . Amenorrhea   . Anovulation 8/91  . Anxiety   . Chronic RLQ pain 1989   secondary to adhesions  . Depression    grief counseling  . Diverticulosis of colon (without mention of hemorrhage)   . Osteoporosis   . PONV (postoperative nausea and vomiting)   . Premature menopause age 51   On HRT age 89 - 2007  . Smoker     Past Surgical History:  Procedure Laterality Date  . BREAST EXCISIONAL BIOPSY Right 06/11/2006  . CHEILECTOMY Right 9/06   great toe   . COLONOSCOPY  1988   normal  . COLONOSCOPY  9/98   normal  . COLONOSCOPY  11/03   normal  . COLONOSCOPY W/ BIOPSIES  03/2008   mild diverticula - recheck 7 years  . HYSTEROSCOPY  2003  . LAPAROSCOPIC OVARIAN CYSTECTOMY Right 1985  . MM BREAST STEREO BX*L*R/S Right 06/2006    bloody nipple discharge - path reveals intraductal papilloma Dr. Annamaria Boots  . TRIGGER FINGER RELEASE Right 09/18/2016   Procedure: RELEASE TRIGGER FINGER/A-1 PULLEY RIGHT THUMB;  Surgeon: Daryll Brod, MD;  Location: [place];  Service: Orthopedics;  Laterality: Right;  REG/FAB    Prior to Admission medications   Medication Sig Start Date End Date Taking? Authorizing Provider  ALPRAZolam Duanne Moron) 0.5 MG tablet Take 0.5 mg by mouth every 8  (eight) hours as needed. for anxiety 03/01/14  Yes [provider]  Calcium Carb-Cholecalciferol (CALTRATE 600+D) 600-800 MG-UNIT TABS Take 1 tablet by mouth daily.   Yes [provider]  cholecalciferol (VITAMIN D) 1000 UNITS tablet Take 1,000 Units by mouth daily.   Yes [provider]  ibuprofen (ADVIL,MOTRIN) 400 MG tablet Take 400 mg by mouth every 6 (six) hours as needed.   Yes [provider]  Magnesium 250 MG TABS Take by mouth daily.   Yes [provider]  Multiple Vitamin (MULTIVITAMIN) tablet Take 1 tablet by mouth daily.   Yes [provider]  traMADol (ULTRAM) 50 MG tablet Take 1 tablet (50 mg total) by mouth every 6 (six) hours as needed. 09/18/16  Yes Daryll Brod, MD  vitamin C (ASCORBIC ACID) 500 MG tablet Take 500 mg by mouth daily. Reported on 09/23/2015   Yes [provider]    Current Facility-Administered Medications  Medication Dose Route Frequency Provider Last Rate Last Dose  . 0.9 %  sodium chloride infusion   Intravenous Continuous Gatha Mayer, MD       Current Outpatient Prescriptions  Medication Sig Dispense Refill  . ALPRAZolam (XANAX) 0.5 MG tablet Take 0.5 mg by mouth every 8 (eight) hours as needed. for anxiety  1  . Calcium Carb-Cholecalciferol (CALTRATE 600+D) 600-800 MG-UNIT TABS Take 1 tablet by mouth daily.    . cholecalciferol (  VITAMIN D) 1000 UNITS tablet Take 1,000 Units by mouth daily.    Marland Kitchen ibuprofen (ADVIL,MOTRIN) 400 MG tablet Take 400 mg by mouth every 6 (six) hours as needed.    . Magnesium 250 MG TABS Take by mouth daily.    . Multiple Vitamin (MULTIVITAMIN) tablet Take 1 tablet by mouth daily.    . traMADol (ULTRAM) 50 MG tablet Take 1 tablet (50 mg total) by mouth every 6 (six) hours as needed. 20 tablet 0  . vitamin C (ASCORBIC ACID) 500 MG tablet Take 500 mg by mouth daily. Reported on 09/23/2015      Allergies as of 11/08/2016 - Review Complete 11/08/2016  Allergen Reaction Noted   . Amoxicillin-pot clavulanate  03/09/2008  . Ceclor [cefaclor]  03/09/2008  . Codeine  03/09/2008  . Keflex [cephalexin]  05/26/2014    Family History  Problem Relation Age of Onset  . Colon cancer Maternal Uncle 38  . Uterine cancer Mother   . Hyperlipidemia Mother   . Hypertension Brother   . Heart failure Maternal Grandfather   . Colon cancer Other     Social History   Social History  . Marital status: Married    Spouse name: N/A  . Number of children: N/A  . Years of education: N/A   Occupational History  . Not on file.   Social History Main Topics  . Smoking status: Former Smoker    Quit date: 03/19/2000  . Smokeless tobacco: Never Used  . Alcohol use No  . Drug use: No  . Sexual activity: No   Other Topics Concern  . Not on file   Social History Narrative  . No narrative on file    Review of Systems:  All other review of systems negative except as mentioned in the HPI.  Physical Exam: Vital signs in last 24 hours: Temp:  [98.6 F (37 C)] 98.6 F (37 C) (08/23 1534) Pulse Rate:  [95] 95 (08/23 1534) Resp:  [16-23] 23 (08/23 1758) BP: (159-184)/(110-123) 159/110 (08/23 1758) SpO2:  [98 %-99 %] 99 % (08/23 1758) Weight:  [216 lb (98 kg)] 216 lb (98 kg) (08/23 1758)   General:   Alert,  Well-developed, well-nourished, pleasant and cooperative in NAD Lungs:  Clear throughout to auscultation.   Heart:  Regular rate and rhythm; no murmurs, clicks, rubs,  or gallops. Abdomen:  Soft, nontender and nondistended. Normal bowel sounds.   Neuro/Psych:  Alert and cooperative. Normal mood and affect. A and O x 3   @Carl  Simonne Maffucci, MD, [facility] [phone] (pager) 11/08/2016 6:07 PM@

## 2016-11-08 NOTE — ED Triage Notes (Signed)
Pt states that she has had swallowing difficulties, thinking that she is getting food hung in her esophagus. Saw her PCP today who sent her in for a GI consult and possible endoscopy. Alert and oriented. Neuro intact. Pt is able to swallow water.

## 2016-11-08 NOTE — Telephone Encounter (Signed)
Patient states that she is at the ER bc she went to pcp today and is having trouble swallowing. Pt wants to know if she should wait at ER for 4 hours or should if this is something that could wait and come in for ov asap

## 2016-11-08 NOTE — Telephone Encounter (Signed)
Patient has also called her PCP. Before I was able to call to the patient, Dr Inda Merlin office is calling asking if there was to be an order waiting for the patient at [facility]. Dr Inda Merlin has called back to speak with the doctor of the d.

## 2016-11-08 NOTE — Telephone Encounter (Signed)
Dr Darcus Austin calls. The patient is in office. Patient reports to Dr Inda Merlin she choked on a potato chip last night. Swallowing was uncomfortable during night. Today PO solids and fluids regurgitated. Half a protein bar stayed down. Patient is uncomfortable. Feels unable to swallow. Dr Ardis Hughs notified. Patient will present to the [facility] ER for evaluation by ER provider. GI consult if determined to be needed by ER provider.

## 2016-11-08 NOTE — Discharge Instructions (Signed)
° °  No food left in there.  I saw and esophageal ring and a small erosion in the esophagus. The erosion could be from acid reflux, vomiting or where food was stuck earlier.  I am prescribing pantoprazole to treat you.  Please call the office soon to obtain a follow-up visit for 6-8 weeks from now.  I appreciate the opportunity to care for you.  YOU HAD AN ENDOSCOPIC PROCEDURE TODAY: Refer to the procedure report and other information in the discharge instructions given to you for any specific questions about what was found during the examination. If this information does not answer your questions, please call Dr. Celesta Aver office at [phone] to clarify.   YOU SHOULD EXPECT: Some feelings of bloating in the abdomen. Passage of more gas than usual. Walking can help get rid of the air that was put into your GI tract during the procedure and reduce the bloating. If you had a lower endoscopy (such as a colonoscopy or flexible sigmoidoscopy) you may notice spotting of blood in your stool or on the toilet paper. Some abdominal soreness may be present for a day or two, also.  DIET: Your first meal following the procedure should be a light meal and then it is ok to progress to your normal diet. A half-sandwich or bowl of soup is an example of a good first meal. Heavy or fried foods are harder to digest and may make you feel nauseous or bloated. Drink plenty of fluids but you should avoid alcoholic beverages for 24 hours.   ACTIVITY: Your care partner should take you home directly after the procedure. You should plan to take it easy, moving slowly for the rest of the day. You can resume normal activity the day after the procedure however YOU SHOULD NOT DRIVE, use power tools, machinery or perform tasks that involve climbing or major physical exertion for 24 hours (because of the sedation medicines used during the test).   SYMPTOMS TO REPORT IMMEDIATELY: A gastroenterologist can be reached at any  hour. Please call [phone]  for any of the following symptoms:   Following upper endoscopy (EGD, EUS, ERCP, esophageal dilation) Vomiting of blood or coffee ground material  New, significant abdominal pain  New, significant chest pain or pain under the shoulder blades  Painful or persistently difficult swallowing  New shortness of breath  Black, tarry-looking or red, bloody stools  FOLLOW UP:  If any biopsies were taken you will be contacted by phone or by letter within the next 1-3 weeks. Call [phone]  if you have not heard about the biopsies in 3 weeks.  Please also call with any specific questions about appointments or follow up tests.

## 2016-11-09 ENCOUNTER — Telehealth: Payer: Self-pay | Admitting: Internal Medicine

## 2016-11-09 ENCOUNTER — Telehealth: Payer: Self-pay

## 2016-11-09 ENCOUNTER — Other Ambulatory Visit: Payer: Self-pay

## 2016-11-09 ENCOUNTER — Encounter (HOSPITAL_COMMUNITY): Payer: Self-pay | Admitting: Internal Medicine

## 2016-11-09 MED ORDER — METOCLOPRAMIDE HCL 5 MG PO TABS: 5.0000 mg | ORAL_TABLET | Freq: Three times a day (TID) | ORAL | 0 refills | Status: DC

## 2016-11-09 NOTE — Telephone Encounter (Signed)
Spoke with the patient. She has retained a protein bar. She describes a regurgitation "if I bend over" and a "lot of burping." She had not picked up her pantoprazole 40 mg this morning. She took an Omeprazole 20 mg. She is feeling anxious. Takes daily Xanax, which sh has not had yet. She will take this after this phone call.  What do you advise on her "regurgitation" and "burping" ?

## 2016-11-09 NOTE — Telephone Encounter (Signed)
I am confused about the retained protein bar comment - there was no food impaction when I scoped her though we think she had one.  Did she have a problem with a protein bar today or is she referring to yesterday? If so I need to know  She definitely needs her Xanax.  She needs to take the PPI.  She should stay on a soft diet for now.  Things should get better with time.  She needs to schedule a f/u appt - she asked to f/u me but is Dr. Woodward Ku patient and I told her I could not make that change without a formal request.  Let's have her f/u APP next 1-2 weeks please.  Anxiety may be main issue.

## 2016-11-09 NOTE — Telephone Encounter (Signed)
Dr Silverio Decamp The patient is requesting to see Dr Carlean Purl. Will you release her to his care? She does not have any complaints.

## 2016-11-09 NOTE — Telephone Encounter (Signed)
Thank you for your quick response. Alexandria Mercado ate a protein bar again today. It does not reflux or regurgitate. She states, "I vomit up liquids when I bend over. I ate a protein bar this morning and it does not come up. Only the milk or yogurt comes into the back of my throat when I bend over." She denies nausea. She denies chest pain or painful swallowing. She burped loudly over the phone a couple of times during the conversation. Okay to take the Pantoprazole now? And I will help her get an appointment scheduled. I had already tried, but yes, she mentioned she wanted to see you and does not mind waiting if she has to.

## 2016-11-09 NOTE — Telephone Encounter (Signed)
Spoke with the patient again. She is in agreement with the recommendations. She will try the Reglan if she does not feel improvement over the next 24 hours, per her choice.

## 2016-11-09 NOTE — Telephone Encounter (Signed)
We have to do the usual request to change MD's - so please do that separately to Dr. Silverio Decamp as we usually do.  Once we have that done I will work her in instead of an APP  Have Ms Tahir do the soft diet and try not to bend over so much.  We can also try some reglan 5 mg tid ac for a little while # 60 no refill  It will help the stomach empty  She can give [place] an update Monday  Tell her I do not think there is anything bad or dangerous going on  She may need to see her PCP about anxiety

## 2016-11-10 NOTE — Telephone Encounter (Signed)
ok 

## 2016-11-12 NOTE — Telephone Encounter (Signed)
I will see her  If she is ok now then next available appointment in October is ok I think

## 2016-11-12 NOTE — Telephone Encounter (Signed)
Patient contacted. She states she improved over the weekend. She did not take the Reglan. Feels much better today. Follow up appointment made with Dr Carlean Purl for October.

## 2016-11-12 NOTE — Telephone Encounter (Signed)
Dr Carlean Purl, you had recommended follow up. Will you take this patient? Where would you have her worked in?

## 2016-12-06 ENCOUNTER — Ambulatory Visit (INDEPENDENT_AMBULATORY_CARE_PROVIDER_SITE_OTHER): Payer: BLUE CROSS/BLUE SHIELD

## 2016-12-06 ENCOUNTER — Ambulatory Visit (INDEPENDENT_AMBULATORY_CARE_PROVIDER_SITE_OTHER): Payer: BLUE CROSS/BLUE SHIELD | Admitting: Podiatry

## 2016-12-06 ENCOUNTER — Telehealth: Payer: Self-pay | Admitting: Internal Medicine

## 2016-12-06 ENCOUNTER — Encounter: Payer: Self-pay | Admitting: Podiatry

## 2016-12-06 ENCOUNTER — Ambulatory Visit: Payer: BLUE CROSS/BLUE SHIELD

## 2016-12-06 VITALS — BP 152/87 | HR 92

## 2016-12-06 DIAGNOSIS — M205X2 Other deformities of toe(s) (acquired), left foot: Secondary | ICD-10-CM | POA: Diagnosis not present

## 2016-12-06 DIAGNOSIS — M79672 Pain in left foot: Secondary | ICD-10-CM

## 2016-12-06 DIAGNOSIS — M659 Synovitis and tenosynovitis, unspecified: Secondary | ICD-10-CM | POA: Diagnosis not present

## 2016-12-06 MED ORDER — TRIAMCINOLONE ACETONIDE 10 MG/ML IJ SUSP
5.0000 mg | Freq: Once | INTRAMUSCULAR | Status: AC
  Administered 2016-12-06: 5 mg

## 2016-12-06 MED ORDER — MELOXICAM 15 MG PO TABS: 15.0000 mg | ORAL_TABLET | Freq: Every day | ORAL | 0 refills | Status: DC

## 2016-12-06 NOTE — Telephone Encounter (Signed)
Yes, as long as she stays on pantoprazole

## 2016-12-06 NOTE — Telephone Encounter (Signed)
Patient has been started on Mobic for arthritis in her foot.  OK for her to take?

## 2016-12-06 NOTE — Telephone Encounter (Signed)
I left a detailed message for the patient with Dr. Celesta Aver response.  She is asked to call back if she has any additional questions or concerns.

## 2016-12-06 NOTE — Progress Notes (Signed)
Subjective:    Patient ID: Alexandria Mercado, female    DOB: [birth year], 61 y.o.   MRN: [id number]  HPI  Chief Complaint  Patient presents with  . Foot Pain    Left, hallux joint pain and forefoot x 6 months. Pt stated also "concerned about a bone that sticks out on the outside of my foot"   61 y.o. female presents with the above complaint. Reports pain and stiffness in her left big toe joint. Also reports some pain in the ball of her foot. Pain described as sore and stiff.  Had similar issue on her R side some years ago and underwent surgical correction. Still residual stiffness but no pain today.  Past Medical History:  Diagnosis Date  . Amenorrhea   . Anovulation 8/91  . Anxiety   . Chronic RLQ pain 1989   secondary to adhesions  . Depression    grief counseling  . Diverticulosis of colon (without mention of hemorrhage)   . Osteoporosis   . PONV (postoperative nausea and vomiting)   . Premature menopause age 22   On HRT age 16 - 2007  . Smoker    Past Surgical History:  Procedure Laterality Date  . BREAST EXCISIONAL BIOPSY Right 06/11/2006  . CHEILECTOMY Right 9/06   great toe   . COLONOSCOPY  1988   normal  . COLONOSCOPY  9/98   normal  . COLONOSCOPY  11/03   normal  . COLONOSCOPY W/ BIOPSIES  03/2008   mild diverticula - recheck 7 years  . ESOPHAGOGASTRODUODENOSCOPY N/A 11/08/2016   Procedure: ESOPHAGOGASTRODUODENOSCOPY (EGD);  Surgeon: Gatha Mayer, MD;  Location: [facility];  Service: Endoscopy;  Laterality: N/A;  . HYSTEROSCOPY  2003  . LAPAROSCOPIC OVARIAN CYSTECTOMY Right 1985  . MM BREAST STEREO BX*L*R/S Right 06/2006    bloody nipple discharge - path reveals intraductal papilloma Dr. Annamaria Boots  . TRIGGER FINGER RELEASE Right 09/18/2016   Procedure: RELEASE TRIGGER FINGER/A-1 PULLEY RIGHT THUMB;  Surgeon: Daryll Brod, MD;  Location: [place];  Service: Orthopedics;  Laterality: Right;  REG/FAB    Current Outpatient Prescriptions:  .   ALPRAZolam (XANAX) 0.5 MG tablet, Take 0.5 mg by mouth every 8 (eight) hours as needed. for anxiety, Disp: , Rfl: 1 .  Calcium Carb-Cholecalciferol (CALTRATE 600+D) 600-800 MG-UNIT TABS, Take 1 tablet by mouth daily., Disp: , Rfl:  .  cholecalciferol (VITAMIN D) 1000 UNITS tablet, Take 1,000 Units by mouth daily., Disp: , Rfl:  .  cyclobenzaprine (FLEXERIL) 10 MG tablet, TAKE 1 TABLET EVERY DAY IF NEEDED, Disp: , Rfl: 0 .  hydrochlorothiazide (HYDRODIURIL) 25 MG tablet, TAKE 1 TABLET BY MOUTH EVERY DAY IN THE MORNING, Disp: , Rfl: 1 .  ibuprofen (ADVIL,MOTRIN) 400 MG tablet, Take 400 mg by mouth every 6 (six) hours as needed., Disp: , Rfl:  .  Magnesium 250 MG TABS, Take by mouth daily., Disp: , Rfl:  .  Multiple Vitamin (MULTIVITAMIN) tablet, Take 1 tablet by mouth daily., Disp: , Rfl:  .  Omega-3 Fatty Acids (FISH OIL PO), Take by mouth., Disp: , Rfl:  .  pantoprazole (PROTONIX) 40 MG tablet, Take 1 tablet (40 mg total) by mouth daily before breakfast., Disp: 30 tablet, Rfl: 2 .  UNABLE TO FIND, Med Name: Tumeric, Disp: , Rfl:  .  vitamin C (ASCORBIC ACID) 500 MG tablet, Take 500 mg by mouth daily. Reported on 09/23/2015, Disp: , Rfl:  .  meloxicam (MOBIC) 15 MG tablet, Take 1 tablet (  15 mg total) by mouth daily., Disp: 30 tablet, Rfl: 0 .  traMADol (ULTRAM) 50 MG tablet, Take 1 tablet (50 mg total) by mouth every 6 (six) hours as needed. (Patient not taking: Reported on 12/06/2016), Disp: 20 tablet, Rfl: 0  Allergies  Allergen Reactions  . Amoxicillin-Pot Clavulanate     REACTION: rash  . Ceclor [Cefaclor]     REACTION: rash  . Codeine     REACTION: nausea  . Keflex [Cephalexin]     itching    Review of Systems  HENT: Positive for tinnitus.   Genitourinary: Positive for frequency.  Musculoskeletal: Positive for arthralgias and gait problem.  All other systems reviewed and are negative.      Objective:   Physical Exam Vitals:   12/06/16 0944  BP: (!) 152/87  Pulse: 92    General AA&O x3. Normal mood and affect.  Vascular Dorsalis pedis and posterior tibial pulses  present 2+ bilaterally. Capillary refill normal to all digits. Pedal hair growth normal.  Neurologic Epicritic sensation grossly intact.  Dermatologic No open lesions. Interspaces clear of maceration.  Normal skin temperature and turgor. Hyperkeratotic lesions: none bilaterally  Orthopedic: MMT 5/5 in dorsiflexion, plantarflexion, inversion, and eversion. Hallux abductovalgus deformity slight bilat Left 1st MPJ diminished range with pain. Left 1st TMT without gross hypermobility. Right 1st MPJ diminished range with pain  Right 1st TMT without gross hypermobility. Lesser digital contractures present bilaterally. Pain on palpation L 2nd plantar met head.   Radiographs: Taken and reviewed. Degenerative changes to L 1st MPJ with elevated metatarsal noted. Metatarsal parabola abnormal - elongated 1st/2nd mets.    Assessment & Plan:  Hallux Limitus, L -XR reviewed with patient as above. -Discussed conservative vs surgical treatment with patient. Patient wishes to trial conservative therapy at this time. -Injection delivered as below.  Procedure: Joint Injection Location: Left 1st metatarsophalangeal joint Skin Prep: Alcohol. Injectate: 0.5 cc 0.5% marcaine plain, 0.5 cc dexamethasone phosphate. Disposition: Patient tolerated procedure well. Injection site dressed with a band-aid.  Return in about 6 weeks (around 01/17/2017).

## 2016-12-07 ENCOUNTER — Telehealth: Payer: Self-pay | Admitting: *Deleted

## 2016-12-07 ENCOUNTER — Ambulatory Visit (INDEPENDENT_AMBULATORY_CARE_PROVIDER_SITE_OTHER): Payer: BLUE CROSS/BLUE SHIELD | Admitting: Podiatry

## 2016-12-07 ENCOUNTER — Encounter: Payer: Self-pay | Admitting: Podiatry

## 2016-12-07 VITALS — BP 142/94 | HR 78 | Temp 97.4°F

## 2016-12-07 DIAGNOSIS — L600 Ingrowing nail: Secondary | ICD-10-CM | POA: Diagnosis not present

## 2016-12-07 NOTE — Telephone Encounter (Signed)
Pt states Dr. March Rummage performed a toenail procedure today and she wanted to know when to begin soaks. I instructed pt to begin soaking tomorrow, and asked if she was still having problems with the toe bleeding through as mentioned by Bethann Humble - Record Coordinator that answer the phone. Pt states she has noticed the toe had bleed through into her shoe. I told pt that after the procedure an ointment, and pressure dressing are applied and the tourniquet is released and often the combination with a tight shoe will cause bleeding out of the dressing. I told pt to apply additional gauze, rest and elevate to help stop the bleeding. I told pt if toe became too painful, take OTC Tylenol regular strength as package directs if she is able to tolerate, and if toe continues to be painful begin soaking today. Pt states understanding.

## 2016-12-07 NOTE — Patient Instructions (Signed)
Soak Instructions    Place 1/4 cup of epsom salts in a quart of warm tap water.  Submerge your foot or feet with outer bandage intact for the initial soak; this will allow the bandage to become moist and wet for easy lift off.  Once you remove your bandage, continue to soak in the solution for 20 minutes.  This soak should be done twice a day.  Next, remove your foot or feet from solution, blot dry the affected area and cover.  You may use a band aid large enough to cover the area or use gauze and tape.  Apply other medications to the area as directed by the doctor.   Monitor for any signs/symptoms of infection. Call the office immediately if any occur or go directly to the emergency room. Call with any questions/concerns.

## 2016-12-08 NOTE — Progress Notes (Signed)
   Subjective:    Patient ID: Alexandria Mercado, female    DOB: [birth year], 61 y.o.   MRN: [id number]  HPI Chief Complaint  Patient presents with  . Nail Problem    Lt hallux nail red swollen and painful, denies injury. Treating with peroxide and neosporin    61 y.o. female returns for the above complaint. Was seen yesterday for and given an injection for arthritis of her great toe joint and states her pain is improved. Walks in today for complaint of ingrowing nail to the L great toe. Reports swelling but not drainage. Denies other issues.  Review of Systems     Objective:   Physical Exam Vitals:   12/07/16 0745  BP: (!) 142/94  Pulse: 78  Temp: (!) 97.4 F (36.3 C)   General AA&O x3. Normal mood and affect.  Vascular Dorsalis pedis and posterior tibial pulses  present 2+ bilaterally  Capillary refill normal to all digits. Pedal hair growth normal.  Neurologic Epicritic sensation present bilaterally.  Dermatologic No open lesions. Interspaces clear of maceration.  Normal skin temperature and turgor. Painful ingrowing nail at medial nail borders of the hallux nail left.  Orthopedic: Tenderness to palpation about the L great toenail. No pain on ROM L 1st MPJ.     Assessment & Plan:  Ingrown Nail, left -Nail debrided in slant back fashion as below. -Advised to soak the toe for the next 3 days. Instructions dispensed.  Procedure: Nail Debridement Rationale: painful ingrown nail Type of Debridement: manual, sharp debridement; slant-back fashion. Instrumentation: Nail nipper, nail curette Number of Nails: 1

## 2016-12-27 ENCOUNTER — Other Ambulatory Visit: Payer: Self-pay | Admitting: Gastroenterology

## 2016-12-27 MED ORDER — PANTOPRAZOLE SODIUM 40 MG PO TBEC: 40.0000 mg | DELAYED_RELEASE_TABLET | Freq: Every day | ORAL | 3 refills | Status: DC

## 2016-12-27 NOTE — Telephone Encounter (Signed)
Received a faxed 90 day rx request from [facility]. Medication refilled.

## 2016-12-28 ENCOUNTER — Telehealth: Payer: Self-pay | Admitting: *Deleted

## 2016-12-28 MED ORDER — MELOXICAM 15 MG PO TABS: 15.0000 mg | ORAL_TABLET | Freq: Every day | ORAL | 0 refills | Status: DC

## 2016-12-28 NOTE — Telephone Encounter (Signed)
Request refill for meloxicam 90 days. Dr. March Rummage states may refill for #30, needs an appt if continuing to have problems.

## 2017-01-04 ENCOUNTER — Encounter: Payer: Self-pay | Admitting: Gastroenterology

## 2017-01-04 ENCOUNTER — Telehealth: Payer: Self-pay | Admitting: *Deleted

## 2017-01-04 NOTE — Telephone Encounter (Signed)
Request for #90 meloxicam. Dr. March Rummage denied states needs an appt if continuing to have problem. Return fax denying.

## 2017-01-14 ENCOUNTER — Ambulatory Visit (INDEPENDENT_AMBULATORY_CARE_PROVIDER_SITE_OTHER): Payer: BLUE CROSS/BLUE SHIELD | Admitting: Internal Medicine

## 2017-01-14 ENCOUNTER — Encounter: Payer: Self-pay | Admitting: Internal Medicine

## 2017-01-14 VITALS — BP 136/88 | HR 80 | Ht 68.0 in | Wt 220.0 lb

## 2017-01-14 DIAGNOSIS — K222 Esophageal obstruction: Secondary | ICD-10-CM

## 2017-01-14 DIAGNOSIS — E669 Obesity, unspecified: Secondary | ICD-10-CM | POA: Diagnosis not present

## 2017-01-14 DIAGNOSIS — K219 Gastro-esophageal reflux disease without esophagitis: Secondary | ICD-10-CM

## 2017-01-14 MED ORDER — PANTOPRAZOLE SODIUM 20 MG PO TBEC: 20.0000 mg | DELAYED_RELEASE_TABLET | Freq: Every day | ORAL | 3 refills | Status: DC

## 2017-01-14 NOTE — Progress Notes (Signed)
Alexandria Mercado 61 y.o. Dec 31, 1955 [id number]  Assessment & Plan:      Subjective:   Chief Complaint:  HPI  Allergies  Allergen Reactions  . Amoxicillin-Pot Clavulanate     REACTION: rash  . Ceclor [Cefaclor]     REACTION: rash  . Codeine     REACTION: nausea  . Keflex [Cephalexin]     itching   Current Meds  Medication Sig  . ALPRAZolam (XANAX) 0.5 MG tablet Take 0.5 mg by mouth every 8 (eight) hours as needed. for anxiety  . Calcium Carb-Cholecalciferol (CALTRATE 600+D) 600-800 MG-UNIT TABS Take 1 tablet by mouth daily.  . cholecalciferol (VITAMIN D) 1000 UNITS tablet Take 1,000 Units by mouth daily.  . cyclobenzaprine (FLEXERIL) 10 MG tablet TAKE 1 TABLET EVERY DAY IF NEEDED  . hydrochlorothiazide (HYDRODIURIL) 25 MG tablet TAKE 1 TABLET BY MOUTH EVERY DAY IN THE MORNING  . ibuprofen (ADVIL,MOTRIN) 400 MG tablet Take 400 mg by mouth every 6 (six) hours as needed.  . Magnesium 250 MG TABS Take by mouth daily.  . Multiple Vitamin (MULTIVITAMIN) tablet Take 1 tablet by mouth daily.  . Omega-3 Fatty Acids (FISH OIL PO) Take 1 capsule by mouth 2 (two) times daily.   . pantoprazole (PROTONIX) 40 MG tablet Take 1 tablet (40 mg total) by mouth daily before breakfast.  . UNABLE TO FIND Med Name: Tumeric  . vitamin C (ASCORBIC ACID) 500 MG tablet Take 500 mg by mouth daily. Reported on 09/23/2015   Past Medical History:  Diagnosis Date  . Amenorrhea   . Anovulation 8/91  . Anxiety   . Chronic RLQ pain 1989   secondary to adhesions  . Depression    grief counseling  . Diverticulosis of colon (without mention of hemorrhage)   . Esophageal obstruction   . Esophageal ring   . Osteoporosis   . PONV (postoperative nausea and vomiting)   . Premature menopause age 83   On HRT age 50 - 2007  . Smoker   . Vitamin D deficiency    Past Surgical History:  Procedure Laterality Date  . BREAST EXCISIONAL BIOPSY Right 06/11/2006  . CHEILECTOMY Right 9/06   great toe   .  COLONOSCOPY  1988   normal  . COLONOSCOPY  9/98   normal  . COLONOSCOPY  11/03   normal  . COLONOSCOPY W/ BIOPSIES  03/2008   mild diverticula - recheck 7 years  . ESOPHAGOGASTRODUODENOSCOPY N/A 11/08/2016   Procedure: ESOPHAGOGASTRODUODENOSCOPY (EGD);  Surgeon: Gatha Mayer, MD;  Location: [facility];  Service: Endoscopy;  Laterality: N/A;  . HYSTEROSCOPY  2003  . LAPAROSCOPIC OVARIAN CYSTECTOMY Right 1985  . MM BREAST STEREO BX*L*R/S Right 06/2006    bloody nipple discharge - path reveals intraductal papilloma Dr. Annamaria Boots  . TRIGGER FINGER RELEASE Right 09/18/2016   Procedure: RELEASE TRIGGER FINGER/A-1 PULLEY RIGHT THUMB;  Surgeon: Daryll Brod, MD;  Location: [place];  Service: Orthopedics;  Laterality: Right;  REG/FAB   Social History   Social History  . Marital status: Married    Spouse name: N/A  . Number of children: N/A  . Years of education: N/A   Occupational History  . Not on file.   Social History Main Topics  . Smoking status: Former Smoker    Quit date: 03/19/2000  . Smokeless tobacco: Never Used  . Alcohol use No  . Drug use: No  . Sexual activity: No   Other Topics Concern  . Not on file  Social History Narrative  . No narrative on file   family history includes Colon cancer in her other; Colon cancer (age of onset: 79) in her maternal uncle; Heart failure in her maternal grandfather; Hyperlipidemia in her mother; Hypertension in her brother; Uterine cancer in her mother.   Review of Systems   Objective:   Physical Exam

## 2017-01-14 NOTE — Progress Notes (Signed)
Alexandria Mercado 61 y.o. September 13, 1955 [id number]  Assessment & Plan:   Encounter Diagnoses  Name Primary?  . GERD with stricture Yes  . Obesity (BMI 30.0-34.9)    Minimal heartburn symptoms but mild erosive esophagitis with lower esophageal ring and dysphagia.  Food impaction sensation in August, no food impaction found in endoscopy.  Treated with pantoprazole 40 mg daily asymptomatic since the first week of starting the medication.  Does drink a lot of diet sodas will need to stop those or reduce those and we discussed weight loss as well.  Lifestyle changes reduce pantoprazole to 20 mg daily.  I did review the weak associations of bone fracture, dementia kidney failure with PPIs but I think in her case these supposed risks but are not proven to be significant overall outweighed by the benefits.  If she masters lifestyle changes with reduced caffeine and weight loss she might be able to come off medication.  I explained to her that she should call me back if she starts to experience in symptoms on the 20 mg dose.  I have asked her to try to refill through primary care though we will help her she does not necessarily need to see the specialist for refills when doing well  I appreciate the opportunity to care for this patient. CC: Darcus Austin, MD     Subjective:   Chief Complaint: Follow-up of dysphagia GERD  HPI Alexandria Mercado is here without complaints of dysphagia or heartburn.  I had seen her in August urgently when it was thought she might of had a food impaction.  It sounds like she did and may be it passed by the time I scoped her urgently.  She had reflux esophagitis and a lower esophageal ring.  PPI was started at 40 mg pantoprazole daily.  After the first week she did not have any dysphagia symptoms at all and has been well since.  She wonders if she needs to stay on the medication.  She drinks several diet colas a day.  He does not smoke.  Wt Readings from Last 3 Encounters:    01/14/17 220 lb (99.8 kg)  11/08/16 216 lb (98 kg)  09/18/16 214 lb (97.1 kg)    Allergies  Allergen Reactions  . Amoxicillin-Pot Clavulanate     REACTION: rash  . Ceclor [Cefaclor]     REACTION: rash  . Codeine     REACTION: nausea  . Keflex [Cephalexin]     itching   Current Meds  Medication Sig  . ALPRAZolam (XANAX) 0.5 MG tablet Take 0.5 mg by mouth every 8 (eight) hours as needed. for anxiety  . Calcium Carb-Cholecalciferol (CALTRATE 600+D) 600-800 MG-UNIT TABS Take 1 tablet by mouth daily.  . cholecalciferol (VITAMIN D) 1000 UNITS tablet Take 1,000 Units by mouth daily.  . cyclobenzaprine (FLEXERIL) 10 MG tablet TAKE 1 TABLET EVERY DAY IF NEEDED  . hydrochlorothiazide (HYDRODIURIL) 25 MG tablet TAKE 1 TABLET BY MOUTH EVERY DAY IN THE MORNING  . ibuprofen (ADVIL,MOTRIN) 400 MG tablet Take 400 mg by mouth every 6 (six) hours as needed.  . Magnesium 250 MG TABS Take by mouth daily.  . Multiple Vitamin (MULTIVITAMIN) tablet Take 1 tablet by mouth daily.  . Omega-3 Fatty Acids (FISH OIL PO) Take 1 capsule by mouth 2 (two) times daily.   Marland Kitchen UNABLE TO FIND Med Name: Tumeric  . vitamin C (ASCORBIC ACID) 500 MG tablet Take 500 mg by mouth daily. Reported on 09/23/2015  . [DISCONTINUED]  pantoprazole (PROTONIX) 40 MG tablet Take 1 tablet (40 mg total) by mouth daily before breakfast.   Past Medical History:  Diagnosis Date  . Amenorrhea   . Anovulation 8/91  . Anxiety   . Chronic RLQ pain 1989   secondary to adhesions  . Depression    grief counseling  . Diverticulosis of colon (without mention of hemorrhage)   . Esophageal obstruction   . Esophageal ring   . GERD with stricture   . Osteoporosis   . PONV (postoperative nausea and vomiting)   . Premature menopause age 30   On HRT age 89 - 2007  . Smoker   . Vitamin D deficiency    Past Surgical History:  Procedure Laterality Date  . BREAST EXCISIONAL BIOPSY Right 06/11/2006  . CHEILECTOMY Right 9/06   great toe   .  COLONOSCOPY  1988   normal  . COLONOSCOPY  9/98   normal  . COLONOSCOPY  11/03   normal  . COLONOSCOPY W/ BIOPSIES  03/2008   mild diverticula - recheck 7 years  . ESOPHAGOGASTRODUODENOSCOPY N/A 11/08/2016   Procedure: ESOPHAGOGASTRODUODENOSCOPY (EGD);  Surgeon: Gatha Mayer, MD;  Location: [facility];  Service: Endoscopy;  Laterality: N/A;  . HYSTEROSCOPY  2003  . LAPAROSCOPIC OVARIAN CYSTECTOMY Right 1985  . MM BREAST STEREO BX*L*R/S Right 06/2006    bloody nipple discharge - path reveals intraductal papilloma Dr. Annamaria Boots  . TRIGGER FINGER RELEASE Right 09/18/2016   Procedure: RELEASE TRIGGER FINGER/A-1 PULLEY RIGHT THUMB;  Surgeon: Daryll Brod, MD;  Location: [facility];  Service: Orthopedics;  Laterality: Right;  REG/FAB   Review of Systems As per HPI  Objective:   Physical Exam BP 136/88   Pulse 80   Ht 5\' 8"  (1.727 m)   Wt 220 lb (99.8 kg)   LMP 09/17/1990 (Approximate)   BMI 33.45 kg/m  No acute distress

## 2017-01-14 NOTE — Patient Instructions (Addendum)
Per Dr Carlean Purl stop your pantoprazole 40mg  tablets and start pantoprazole 20mg  once daily before breakfast.  If this dosage doesn't work for you please call and let us know.  We are giving you a handout to read and follow on GERD.   I appreciate the opportunity to care for you. Silvano Rusk, MD,FACG

## 2017-01-17 ENCOUNTER — Ambulatory Visit: Payer: BLUE CROSS/BLUE SHIELD | Admitting: Podiatry

## 2017-01-18 ENCOUNTER — Ambulatory Visit (INDEPENDENT_AMBULATORY_CARE_PROVIDER_SITE_OTHER): Payer: BLUE CROSS/BLUE SHIELD | Admitting: Podiatry

## 2017-01-18 ENCOUNTER — Encounter: Payer: Self-pay | Admitting: Podiatry

## 2017-01-18 DIAGNOSIS — L03032 Cellulitis of left toe: Secondary | ICD-10-CM

## 2017-01-18 DIAGNOSIS — L600 Ingrowing nail: Secondary | ICD-10-CM

## 2017-01-18 NOTE — Patient Instructions (Signed)

## 2017-01-18 NOTE — Progress Notes (Signed)
  Subjective:  Patient ID: Alexandria Mercado, female    DOB: Nov 14, 1955,  MRN: [id number]  Chief Complaint  Patient presents with  . Toe Pain    Follow up hallux left - medial border   "He cut this toenail back, but yesterday it got really red and swollen"   61 y.o. female returns for the above complaint. States that two days ago she noticed the toe start getting red. Started really hurting yesterday.  Objective:  There were no vitals filed for this visit. General AA&O x3. Normal mood and affect.  Vascular Dorsalis pedis and posterior tibial pulses  present 2+ bilaterally  Capillary refill normal to all digits. Pedal hair growth normal.  Neurologic Epicritic sensation present bilaterally.  Dermatologic No open lesions. Interspaces clear of maceration.  Normal skin temperature and turgor. Painful ingrowing nail at medial nail borders of the hallux nail left.  Orthopedic: MMT 5/5 in dorsiflexion, plantarflexion, inversion, and eversion. Normal lower extremity joint ROM without pain or crepitus.    Assessment & Plan:  Patient was evaluated and treated and all questions answered.  Ingrown Nail, left -Patient elects to proceed with ingrown toenail removal today -Ingrown nail excised. See procedure note. -Educated on post-procedure care including soaking. Written instructions provided. -Patient to follow up in 2 weeks for nail check.  Procedure: Excision of Ingrown Toenail Location: Left 1st toe medial nail borders. Anesthesia: Lidocaine 1% plain; 1.24mL and Marcaine 0.5% plain; 1.70mL, digital block. Skin Prep: Betadine. Dressing: Silvadene; telfa; dry, sterile, compression dressing. Technique: Following skin prep, the toe was exsanguinated and a tourniquet was secured at the base of the toe. The affected nail border was freed, split with a nail splitter, and excised. Chemical matrixectomy was then performed with phenol and irrigated out with alcohol. The tourniquet was then removed and  sterile dressing applied. Disposition: Patient tolerated procedure well. Patient to return in 2 weeks for follow-up.

## 2017-01-24 ENCOUNTER — Ambulatory Visit (INDEPENDENT_AMBULATORY_CARE_PROVIDER_SITE_OTHER): Payer: BLUE CROSS/BLUE SHIELD | Admitting: Podiatry

## 2017-01-24 ENCOUNTER — Encounter: Payer: Self-pay | Admitting: Podiatry

## 2017-01-24 DIAGNOSIS — M659 Synovitis and tenosynovitis, unspecified: Secondary | ICD-10-CM

## 2017-01-24 DIAGNOSIS — Z9889 Other specified postprocedural states: Secondary | ICD-10-CM

## 2017-01-30 ENCOUNTER — Telehealth: Payer: Self-pay | Admitting: Podiatry

## 2017-01-30 NOTE — Telephone Encounter (Signed)
I informed pt that she may still have the drainage for 4-6 weeks after the toenail procedure, to continue the Epsom salt soaks. Pt states he told her last week, she should allow the area to dry out. I told pt to continue the soaks, cover with dry bandaid when in a shoe, but allow to air dry when resting and not in a shoe. Pt states understanding.

## 2017-01-30 NOTE — Telephone Encounter (Signed)
I was seen last week and I wanted to know if it is normal and/or okay that my toe is still draining and bleeding. Please call me back at [phone]. Thanks.

## 2017-02-10 NOTE — Progress Notes (Deleted)
-    Subjective:  Patient ID: Alexandria Mercado, female    DOB: [birth year],  MRN: [id number]  Chief Complaint  Patient presents with  . Ingrown Toenail    f/u doing well, some tenderness, soaking BID   61 y.o. female returns for the above complaint.   Objective:  There were no vitals filed for this visit. General AA&O x3. Normal mood and affect.  Vascular Pedal pulses palpable.  Neurologic Epicritic sensation grossly intact.  Dermatologic No open lesions. Skin normal texture and turgor.  Orthopedic: No pain to palpation either foot.   Assessment & Plan:  Patient was evaluated and treated and all questions answered.  *** -  Return in about 6 weeks (around 03/07/2017) for Hallux Limitus.

## 2017-02-10 NOTE — Progress Notes (Signed)
  Subjective:  Patient ID: Alexandria Mercado, female    DOB: [birth year],  MRN: [id number]  Chief Complaint  Patient presents with  . Ingrown Toenail    f/u doing well, some tenderness, soaking BID   61 y.o. female returns for the above complaint.   Objective:   General AA&O x3. Normal mood and affect.  Vascular Foot warm and well perfused with good capillary refill.  Neurologic Sensation grossly intact.  Dermatologic Nail avulsion site healing well without drainage or erythema. Nail bed with overlying soft crust. Left intact. No signs of local infection.  Orthopedic: No tenderness to palpation of the toe.   Assessment & Plan:  Patient was evaluated and treated and all questions answered.  S/p Ingrown Toenail Excision, L -Healing well without issue. -Discussed return precautions. -F/u PRN

## 2017-03-01 ENCOUNTER — Ambulatory Visit: Payer: BLUE CROSS/BLUE SHIELD | Admitting: Podiatry

## 2017-03-01 DIAGNOSIS — M205X2 Other deformities of toe(s) (acquired), left foot: Secondary | ICD-10-CM

## 2017-03-01 DIAGNOSIS — L600 Ingrowing nail: Secondary | ICD-10-CM

## 2017-03-03 NOTE — Progress Notes (Signed)
  Subjective:  Patient ID: Alexandria Mercado, female    DOB: [birth year],  MRN: [id number]  No chief complaint on file.  61 y.o. female returns for the above complaint.  States that her left toenail is doing great.  States that she has some complaints about her right great toenail that she believes is ingrowing.  Also complains of recurrence of pain at the first MPJ on her left foot.  States the injection did help for several months  Objective:  There were no vitals filed for this visit. General AA&O x3. Normal mood and affect.  Vascular Pedal pulses palpable.  Neurologic Epicritic sensation grossly intact.  Dermatologic No open lesions. Skin normal texture and turgor.  Ingrown nail right hallux medial border  Orthopedic: Pain on range of motion left hallux, pain on palpation medial border right hallux nail   Assessment & Plan:  Patient was evaluated and treated and all questions answered.  Hallux limitus left -Injection delivered as below  Procedure: Joint Injection Location: Left first MPJ joint Skin Prep: Alcohol. Injectate: 0.5 cc 1% lidocaine plain, 0.5 cc dexamethasone phosphate. Disposition: Patient tolerated procedure well. Injection site dressed with a band-aid.  Ingrowing nail right hallux -Nail debrided in slant back fashion with relief noted

## 2017-03-07 ENCOUNTER — Ambulatory Visit: Payer: BLUE CROSS/BLUE SHIELD | Admitting: Podiatry

## 2017-03-29 ENCOUNTER — Telehealth: Payer: Self-pay | Admitting: *Deleted

## 2017-03-29 ENCOUNTER — Ambulatory Visit: Payer: BLUE CROSS/BLUE SHIELD | Admitting: Podiatry

## 2017-03-29 DIAGNOSIS — L600 Ingrowing nail: Secondary | ICD-10-CM

## 2017-03-29 NOTE — Progress Notes (Signed)
  Subjective:  Patient ID: Alexandria Mercado, female    DOB: Oct 30, 1955,  MRN: [id number]  Chief Complaint  Patient presents with  . Ingrown Toenail    2 week follow up - still red and sore   62 y.o. female returns for the above complaint. States her L great toe is still sore and red. Also having problems with the nail order on the other side of the nail.    Objective:  There were no vitals filed for this visit. General AA&O x3. Normal mood and affect.  Vascular Pedal pulses palpable.  Neurologic Epicritic sensation grossly intact.  Dermatologic No open lesions. Skin normal texture and turgor.  Orthopedic: No pain to palpation either foot. Ingrown nail L hallux nail both nail borders.   Assessment & Plan:  Patient was evaluated and treated and all questions answered.  Ingrown Nail, left -Patient elects to proceed with ingrown toenail removal today -Ingrown nail excised. See procedure note. -Educated on post-procedure care including soaking. Written instructions provided. -Patient to follow up in 2 weeks for nail check.  Procedure: Excision of Ingrown Toenail Location: Left 1st toe both nail borders. Anesthesia: Lidocaine 1% plain; 62mL and Marcaine 0.5% plain; 1.20mL, digital block. Skin Prep: Alcohol. Dressing: Silvadene; telfa; dry, sterile, compression dressing. Technique: Following skin prep, the toe was exsanguinated and a tourniquet was secured at the base of the toe. The affected nail border was freed, split with a nail splitter, and excised. Chemical matrixectomy was then performed with phenol and irrigated out with alcohol. The tourniquet was then removed and sterile dressing applied. Disposition: Patient tolerated procedure well. Patient to return in 2 weeks for follow-up.   Return in about 2 weeks (around 04/12/2017).

## 2017-03-29 NOTE — Telephone Encounter (Addendum)
Pt states she had a toenail procedure and it is bleeding, but has decreased. I told pt it was not unusual to bleed some after the procedure to reinforce the dressing with gauze and rest and elevate, if it became too uncomfortable she could begin soaks earlier than tomorrow.

## 2017-03-29 NOTE — Patient Instructions (Signed)

## 2017-04-01 ENCOUNTER — Ambulatory Visit (INDEPENDENT_AMBULATORY_CARE_PROVIDER_SITE_OTHER): Payer: BLUE CROSS/BLUE SHIELD | Admitting: Podiatry

## 2017-04-01 DIAGNOSIS — L600 Ingrowing nail: Secondary | ICD-10-CM

## 2017-04-01 DIAGNOSIS — L03032 Cellulitis of left toe: Secondary | ICD-10-CM

## 2017-04-04 NOTE — Progress Notes (Signed)
Patients s/p  Left great toe nail avulsion, bilateral borders. She states that she is still having pain in the nail, states that it feel like a part of the nail is poking into her skin.   Noted distal corner of nail to be sharp and curving into her skin. Trimmed the offending end of the nail with sterile tissue nippers, redressed with neosporin and a bandage. Patient stated relief in pain. Overall area appears to be healing well, with mild redness, no s/s of infection.   She is to follow up at her regularly scheduled appt or sooner if needed

## 2017-04-12 ENCOUNTER — Ambulatory Visit (INDEPENDENT_AMBULATORY_CARE_PROVIDER_SITE_OTHER): Payer: BLUE CROSS/BLUE SHIELD | Admitting: Podiatry

## 2017-04-12 DIAGNOSIS — Z9889 Other specified postprocedural states: Secondary | ICD-10-CM

## 2017-04-12 DIAGNOSIS — L600 Ingrowing nail: Secondary | ICD-10-CM

## 2017-04-12 NOTE — Progress Notes (Signed)
Agree with RN plan

## 2017-04-16 NOTE — Progress Notes (Signed)
  Subjective:  Patient ID: Alexandria Mercado, female    DOB: Jan 03, 1956,  MRN: [id number]  Chief Complaint  Patient presents with  . Ingrown Toenail    2 week follow up - Left   62 y.o. female returns for the above complaint. States the nail has a small portion that is still bothering her.   Objective:   General AA&O x3. Normal mood and affect.  Vascular Foot warm and well perfused with good capillary refill.  Neurologic Sensation grossly intact.  Dermatologic Nail avulsion site healing well without drainage or erythema. Nail bed with overlying soft crust. Left intact. No signs of local infection.  Orthopedic: No tenderness to palpation of the toe.   Assessment & Plan:  Patient was evaluated and treated and all questions answered.  S/p Ingrown Toenail Excision, Left -Healing well without issue. -Discussed return precautions. -Residual distal nail clipped.  Return in about 6 weeks (around 05/24/2017). for Hallux limitus f/u bilat

## 2017-05-24 ENCOUNTER — Ambulatory Visit: Payer: BLUE CROSS/BLUE SHIELD | Admitting: Podiatry

## 2017-05-24 DIAGNOSIS — L600 Ingrowing nail: Secondary | ICD-10-CM | POA: Diagnosis not present

## 2017-05-24 DIAGNOSIS — M205X2 Other deformities of toe(s) (acquired), left foot: Secondary | ICD-10-CM | POA: Diagnosis not present

## 2017-05-27 NOTE — Progress Notes (Signed)
  Subjective:  Patient ID: Alexandria Mercado, female    DOB: 1955/04/24,  MRN: [id number]  Chief Complaint  Patient presents with  . Nail Problem    F/U L ingrown toenail Pt. stated," it's better, just a little tender. Also, I want the Dr to check B/L great toenails medial corners." Tx: epsom salt soaking and neosporin   62 y.o. female returns for the above complaint.  States that the nail is doing better it is a little tender.  Has been using Epsom salts and Neosporin.  Objective:  There were no vitals filed for this visit. General AA&O x3. Normal mood and affect.  Vascular Pedal pulses palpable.  Neurologic Epicritic sensation grossly intact.  Dermatologic No open lesions. Skin normal texture and turgor.  Orthopedic: No pain to palpation either foot.   Assessment & Plan:  Patient was evaluated and treated and all questions answered.  Ingrown toenail left great toe -Healing well no pain or drainage..  Hallux limitus left -Injection delivered as below  Procedure: Joint Injection Location: Left 1st MPJ Skin Prep: Alcohol. Injectate: 0.5 cc 1% lidocaine plain, 0.5 cc dexamethasone phosphate. Disposition: Patient tolerated procedure well. Injection site dressed with a band-aid.    Return in about 6 weeks (around 07/05/2017) for Hallux limitus f/u.

## 2017-06-06 ENCOUNTER — Other Ambulatory Visit: Payer: Self-pay | Admitting: Family Medicine

## 2017-06-06 DIAGNOSIS — Z1231 Encounter for screening mammogram for malignant neoplasm of breast: Secondary | ICD-10-CM

## 2017-06-11 ENCOUNTER — Other Ambulatory Visit (HOSPITAL_COMMUNITY)
Admission: RE | Admit: 2017-06-11 | Discharge: 2017-06-11 | Disposition: A | Discharge status: Home / Self Care | Payer: BLUE CROSS/BLUE SHIELD | Source: Ambulatory Visit | Attending: Obstetrics & Gynecology | Admitting: Obstetrics & Gynecology

## 2017-06-11 ENCOUNTER — Ambulatory Visit: Payer: BLUE CROSS/BLUE SHIELD | Admitting: Obstetrics & Gynecology

## 2017-06-11 ENCOUNTER — Other Ambulatory Visit: Payer: Self-pay

## 2017-06-11 ENCOUNTER — Encounter: Payer: Self-pay | Admitting: Obstetrics & Gynecology

## 2017-06-11 ENCOUNTER — Ambulatory Visit: Payer: BLUE CROSS/BLUE SHIELD | Admitting: Nurse Practitioner

## 2017-06-11 VITALS — BP 144/80 | HR 92 | Resp 16 | Ht 67.75 in | Wt 225.0 lb

## 2017-06-11 DIAGNOSIS — Z124 Encounter for screening for malignant neoplasm of cervix: Secondary | ICD-10-CM | POA: Diagnosis not present

## 2017-06-11 DIAGNOSIS — Z01411 Encounter for gynecological examination (general) (routine) with abnormal findings: Secondary | ICD-10-CM | POA: Diagnosis not present

## 2017-06-11 MED ORDER — ESTROGENS, CONJUGATED 0.625 MG/GM VA CREA: TOPICAL_CREAM | VAGINAL | 2 refills | Status: DC

## 2017-06-11 NOTE — Progress Notes (Signed)
62 y.o. G0P0 MarriedCaucasianF here for annual exam.  Had some rectal bleeding after straining just yesterday.  Had lost colonoscopy 7/17.   Has urinary urgency that is particularly bothersome at night.  Not drinking caffeine helps but not willing to stop drinking caffeine.  Does not want to be on any oral medication for this.  Denies vaginal bleeding.  Patient's last menstrual period was 09/17/1990 (approximate).          Sexually active: No.  The current method of family planning is post menopausal status.    Exercising: No.   Smoker:  no  Health Maintenance: Pap:  05/26/14 Neg. HR HPV:neg  History of abnormal Pap:  no  MMG:  07/04/16 BIRADS1:neg. Has appt 07/10/17  Colonoscopy:  10/07/15 normal. f/u 3-5 years  BMD:   06/29/14 Osteopenia, -1.1, -1.5 TDaP:  2011 Pneumonia vaccine(s):  no Shingrix:   Zostavax 2015 Hep C testing: 05/31/15 Neg  Screening Labs: PCP   reports that she quit smoking about 17 years ago. She has never used smokeless tobacco. She reports that she does not drink alcohol or use drugs.  Past Medical History:  Diagnosis Date  . Amenorrhea   . Anovulation 8/91  . Anxiety   . Chronic RLQ pain 1989   secondary to adhesions  . Depression    grief counseling  . Diverticulosis of colon (without mention of hemorrhage)   . Esophageal obstruction   . Esophageal ring   . GERD with stricture   . Ingrown nail 2018  . Osteoporosis   . PONV (postoperative nausea and vomiting)   . Premature menopause age 23   On HRT age 49 - 2007  . Smoker   . Sprain of right knee 09/2016  . Trigger thumb of right hand 09/2016  . Vitamin D deficiency     Past Surgical History:  Procedure Laterality Date  . BREAST EXCISIONAL BIOPSY Right 06/11/2006  . CHEILECTOMY Right 9/06   great toe   . COLONOSCOPY  1988   normal  . COLONOSCOPY  9/98   normal  . COLONOSCOPY  11/03   normal  . COLONOSCOPY W/ BIOPSIES  03/2008   mild diverticula - recheck 7 years  .  ESOPHAGOGASTRODUODENOSCOPY N/A 11/08/2016   Procedure: ESOPHAGOGASTRODUODENOSCOPY (EGD);  Surgeon: Gatha Mayer, MD;  Location: [facility];  Service: Endoscopy;  Laterality: N/A;  . HYSTEROSCOPY  2003  . LAPAROSCOPIC OVARIAN CYSTECTOMY Right 1985  . MM BREAST STEREO BX*L*R/S Right 06/2006    bloody nipple discharge - path reveals intraductal papilloma Dr. Annamaria Boots  . TRIGGER FINGER RELEASE Right 09/18/2016   Procedure: RELEASE TRIGGER FINGER/A-1 PULLEY RIGHT THUMB;  Surgeon: Daryll Brod, MD;  Location: [facility];  Service: Orthopedics;  Laterality: Right;  REG/FAB    Current Outpatient Medications  Medication Sig Dispense Refill  . ALPRAZolam (XANAX) 0.5 MG tablet Take 0.5 mg by mouth every 8 (eight) hours as needed. for anxiety  1  . Calcium Carb-Cholecalciferol (CALTRATE 600+D) 600-800 MG-UNIT TABS Take 1 tablet by mouth daily.    . cholecalciferol (VITAMIN D) 1000 UNITS tablet Take 1,000 Units by mouth daily.    . Cranberry 400 MG CAPS Take by mouth daily.    . cyclobenzaprine (FLEXERIL) 10 MG tablet TAKE 1 TABLET EVERY DAY IF NEEDED  0  . FIBER PO Take by mouth daily.    . hydrochlorothiazide (HYDRODIURIL) 25 MG tablet TAKE 1 TABLET BY MOUTH EVERY DAY IN THE MORNING  1  . ibuprofen (ADVIL,MOTRIN) 400  MG tablet Take 400 mg by mouth every 6 (six) hours as needed.    . Magnesium 250 MG TABS Take by mouth daily.    . Multiple Vitamin (MULTIVITAMIN) tablet Take 1 tablet by mouth daily.    . Omega-3 Fatty Acids (FISH OIL PO) Take 1 capsule by mouth 2 (two) times daily.     . pantoprazole (PROTONIX) 40 MG tablet Take 1 tablet by mouth daily.  3  . UNABLE TO FIND Med Name: Tumeric     No current facility-administered medications for this visit.     Family History  Problem Relation Age of Onset  . Colon cancer Maternal Uncle 49  . Uterine cancer Mother   . Hyperlipidemia Mother   . Hypertension Brother   . Heart failure Maternal Grandfather   . Colon cancer Other      Review of Systems  Gastrointestinal: Positive for blood in stool and constipation.  Genitourinary: Positive for frequency.  All other systems reviewed and are negative.   Exam:   BP (!) 144/80 (BP Location: Right Arm, Patient Position: Sitting, Cuff Size: Large)   Pulse 92   Resp 16   Ht 5' 7.75" (1.721 m)   Wt 225 lb (102.1 kg)   LMP 09/17/1990 (Approximate)   BMI 34.46 kg/m   Height:   Height: 5' 7.75" (172.1 cm)  Ht Readings from Last 3 Encounters:  06/11/17 5' 7.75" (1.721 m)  01/14/17 5\' 8"  (1.727 m)  11/08/16 5\' 8"  (1.727 m)    General appearance: alert, cooperative and appears stated age Head: Normocephalic, without obvious abnormality, atraumatic Neck: no adenopathy, supple, symmetrical, trachea midline and thyroid normal to inspection and palpation Lungs: clear to auscultation bilaterally Breasts: normal appearance, no masses or tenderness Heart: regular rate and rhythm Abdomen: small umbilical hernia, soft, non-tender; bowel sounds normal; no masses,  no organomegaly Extremities: extremities normal, atraumatic, no cyanosis or edema Skin: Skin color, texture, turgor normal. No rashes or lesions Lymph nodes: Cervical, supraclavicular, and axillary nodes normal. No abnormal inguinal nodes palpated Neurologic: Grossly normal   Pelvic: External genitalia:  no lesions              Urethra:  normal appearing urethra with no masses, tenderness or lesions              Bartholins and Skenes: normal                 Vagina: normal appearing vagina with normal color and discharge, no lesions              Cervix: no lesions              Pap taken: Yes.   Bimanual Exam:  Uterus:  normal size, contour, position, consistency, mobility, non-tender              Adnexa: normal adnexa and no mass, fullness, tenderness               Rectovaginal: Confirms               Anus:  normal sphincter tone, internal hemorrhoid  Noted today at 2 o'clock  Chaperone was present for  exam.  A:  Well Woman with normal exam H/O Premature menopause age 5.  On HRT until 2004 Hypertension H/O low Vit D  P:   Mammogram guidelines reviewed.  Doing 3D due to breast density pap smear and HR HPV obtained today Lab work done with Dr. Inda Merlin Colonoscopy and BMD UTD  Aware to call if rectal bleeding continues Trial of premarin vaginal cream 1/2 gm pv twice weekly.  #30gm/2RF.  Estrogen risks reviewed. return annually or prn

## 2017-06-13 LAB — CYTOLOGY - PAP
Diagnosis: NEGATIVE
HPV (WINDOPATH): NOT DETECTED

## 2017-07-04 ENCOUNTER — Ambulatory Visit: Payer: BLUE CROSS/BLUE SHIELD | Admitting: Podiatry

## 2017-07-04 DIAGNOSIS — M25372 Other instability, left ankle: Secondary | ICD-10-CM | POA: Diagnosis not present

## 2017-07-04 DIAGNOSIS — L600 Ingrowing nail: Secondary | ICD-10-CM

## 2017-07-04 DIAGNOSIS — M205X2 Other deformities of toe(s) (acquired), left foot: Secondary | ICD-10-CM | POA: Diagnosis not present

## 2017-07-04 DIAGNOSIS — M659 Synovitis and tenosynovitis, unspecified: Secondary | ICD-10-CM | POA: Diagnosis not present

## 2017-07-04 DIAGNOSIS — R2681 Unsteadiness on feet: Secondary | ICD-10-CM | POA: Diagnosis not present

## 2017-07-10 ENCOUNTER — Ambulatory Visit
Admission: RE | Admit: 2017-07-10 | Discharge: 2017-07-10 | Disposition: A | Discharge status: Home / Self Care | Payer: BLUE CROSS/BLUE SHIELD | Source: Ambulatory Visit | Attending: Family Medicine | Admitting: Family Medicine

## 2017-07-10 DIAGNOSIS — Z1231 Encounter for screening mammogram for malignant neoplasm of breast: Secondary | ICD-10-CM

## 2017-07-11 ENCOUNTER — Telehealth: Payer: Self-pay | Admitting: *Deleted

## 2017-07-11 MED ORDER — MELOXICAM 15 MG PO TABS: 15.0000 mg | ORAL_TABLET | Freq: Every day | ORAL | 0 refills | Status: DC

## 2017-07-11 NOTE — Telephone Encounter (Signed)
Refill request for meloxicam. Dr. March Rummage states refill and pt needs an appt if continuing to have pain.

## 2017-07-11 NOTE — Telephone Encounter (Signed)
Request 90 day supply meloxicam. Dr. March Rummage states fill once and needs an appt prior to refills if continues to have pain.

## 2017-07-14 NOTE — Progress Notes (Signed)
  Subjective:  Patient ID: Alexandria Mercado, female    DOB: September 29, 1955,  MRN: [id number]  Chief Complaint  Patient presents with  . Nail Problem    trim Left great toenail, otherwise toes are feeling good   62 y.o. female returns for the above complaint. Reports L ankle pain and instability with recent sprain. L great toe not hurting today in the joint but believes she has an early ingrown toenail.  Objective:  There were no vitals filed for this visit. General AA&O x3. Normal mood and affect.  Vascular Pedal pulses palpable.  Neurologic Epicritic sensation grossly intact.  Dermatologic No open lesions. Skin normal texture and turgor.  Orthopedic: Pain L ATFL. Pain L hallux nail both borders.   Assessment & Plan:  Patient was evaluated and treated and all questions answered.  Ankle Instability L -Dispense Trilock  L Hallux Limitus -No injection today. -Patient to call for injection.  Onychomycosis L -Courtesy debridement slant back fashion.  Return if symptoms worsen or fail to improve.

## 2017-07-21 ENCOUNTER — Other Ambulatory Visit: Payer: Self-pay | Admitting: Podiatry

## 2017-10-02 ENCOUNTER — Ambulatory Visit: Payer: BLUE CROSS/BLUE SHIELD | Admitting: Physician Assistant

## 2017-10-09 ENCOUNTER — Encounter: Payer: Self-pay | Admitting: Podiatry

## 2017-10-09 ENCOUNTER — Ambulatory Visit: Payer: BLUE CROSS/BLUE SHIELD | Admitting: Podiatry

## 2017-10-09 ENCOUNTER — Ambulatory Visit (INDEPENDENT_AMBULATORY_CARE_PROVIDER_SITE_OTHER): Payer: BLUE CROSS/BLUE SHIELD

## 2017-10-09 DIAGNOSIS — M205X2 Other deformities of toe(s) (acquired), left foot: Secondary | ICD-10-CM | POA: Diagnosis not present

## 2017-10-09 DIAGNOSIS — M7662 Achilles tendinitis, left leg: Secondary | ICD-10-CM

## 2017-10-09 DIAGNOSIS — R269 Unspecified abnormalities of gait and mobility: Secondary | ICD-10-CM

## 2017-10-09 DIAGNOSIS — R52 Pain, unspecified: Secondary | ICD-10-CM

## 2017-10-09 DIAGNOSIS — M25372 Other instability, left ankle: Secondary | ICD-10-CM

## 2017-10-09 DIAGNOSIS — M779 Enthesopathy, unspecified: Secondary | ICD-10-CM

## 2017-10-09 NOTE — Progress Notes (Signed)
  Subjective:  Patient ID: Alexandria Mercado, female    DOB: 1955-06-30,  MRN: [id number]  Chief Complaint  Patient presents with  . Toe Pain    having left great toe pain   62 y.o. female returns for the above complaint.  Left ankle pain is worse on able to ambulate since this last weekend.  States that the left great toe is painful again injection helped but she would like to try another one.  Objective:  There were no vitals filed for this visit. General AA&O x3. Normal mood and affect.  Vascular Pedal pulses palpable.  Neurologic Epicritic sensation grossly intact.  Dermatologic No open lesions. Skin normal texture and turgor.  Orthopedic: Pain on ROM L 1st MPJ. Crepitus on ROM. POP L ATFL.   Assessment & Plan:  Patient was evaluated and treated and all questions answered.  Ankle Instability L -Dispense CAM boot. -Refer to PT  L Hallux Limitus -Injection again today. -Will plan for surgical correction at next visit if symptomatic.  Procedure: Joint Injection Location: Left 1stMPJ joint Skin Prep: Alcohol. Injectate: 0.5 cc 1% lidocaine plain, 0.5 cc dexamethasone phosphate. Disposition: Patient tolerated procedure well. Injection site dressed with a band-aid.  Onychomycosis L -Courtesy debridement slant back fashion.  Return in about 3 weeks (around 10/30/2017) for Hallux Limitus, Left; Ankle Pain L.

## 2017-10-10 ENCOUNTER — Ambulatory Visit: Payer: BLUE CROSS/BLUE SHIELD | Admitting: Podiatry

## 2017-10-10 NOTE — Addendum Note (Signed)
Addended by: Harriett Sine D on: 10/10/2017 03:11 PM   Modules accepted: Orders

## 2017-10-11 ENCOUNTER — Telehealth: Payer: Self-pay | Admitting: Podiatry

## 2017-10-11 NOTE — Telephone Encounter (Signed)
I'm a pt of Dr. Eleanora Neighbor and he gave me a tri lock ankle brace. I've misplaced it and I was calling to find out if I could get another one and if so what the price would be? I can be reached at [phone]. Thank you.

## 2017-10-31 ENCOUNTER — Ambulatory Visit: Payer: BLUE CROSS/BLUE SHIELD | Admitting: Podiatry

## 2017-11-07 ENCOUNTER — Encounter: Payer: Self-pay | Admitting: Podiatry

## 2017-11-07 ENCOUNTER — Ambulatory Visit: Payer: BLUE CROSS/BLUE SHIELD | Admitting: Podiatry

## 2017-11-07 DIAGNOSIS — M659 Unspecified synovitis and tenosynovitis, unspecified site: Secondary | ICD-10-CM

## 2017-11-07 DIAGNOSIS — M25372 Other instability, left ankle: Secondary | ICD-10-CM | POA: Diagnosis not present

## 2017-11-07 DIAGNOSIS — M79609 Pain in unspecified limb: Secondary | ICD-10-CM

## 2017-11-07 DIAGNOSIS — B351 Tinea unguium: Secondary | ICD-10-CM

## 2017-11-07 DIAGNOSIS — M79676 Pain in unspecified toe(s): Secondary | ICD-10-CM | POA: Diagnosis not present

## 2017-11-07 DIAGNOSIS — L600 Ingrowing nail: Secondary | ICD-10-CM | POA: Diagnosis not present

## 2017-11-07 DIAGNOSIS — M205X2 Other deformities of toe(s) (acquired), left foot: Secondary | ICD-10-CM | POA: Diagnosis not present

## 2017-11-07 DIAGNOSIS — M779 Enthesopathy, unspecified: Secondary | ICD-10-CM

## 2017-11-07 NOTE — Progress Notes (Signed)
Subjective:  Patient ID: Alexandria Mercado, female    DOB: 1956-03-03,  MRN: [id number]  Chief Complaint  Patient presents with  . Tendonitis    left ankle follow up; pt stated, "better, just a little pain, only did 4 PT sesstions"  . Hallux Limitus    left foot great toe; pt stated, "hurting today (7/10), nail is split, would like corner taken off"    62 y.o. female presents with the above complaint.  Completed physical therapy for the left ankle.  States that she does not have much pain.  Did 4 PT sessions but continues to do exercises at home.  Feels more stable on the ankle.  Complains of recurrence of pain in the left big toe joint.  Hurting 7 out of 10 today.  Still not ready for surgical intervention because she wants to wait until cooler weather.  Also complains that the nail is split on the left great toe and the nail is painful ingrowing.  Also having similar issue on the right great toenail.  Review of Systems: Negative except as noted in the HPI. Denies N/V/F/Ch.  Past Medical History:  Diagnosis Date  . Anxiety   . Chronic RLQ pain 1989   secondary to adhesions  . Depression    grief counseling  . Diverticulosis of colon (without mention of hemorrhage)   . Esophageal obstruction   . Esophageal ring   . GERD with stricture   . Ingrown nail 2018  . PONV (postoperative nausea and vomiting)   . Premature menopause age 66   On HRT age 6 - 2007  . Smoker   . Sprain of right knee 09/2016  . Trigger thumb of right hand 09/2016  . Vitamin D deficiency     Current Outpatient Medications:  .  ALPRAZolam (XANAX) 0.5 MG tablet, Take 0.5 mg by mouth every 8 (eight) hours as needed. for anxiety, Disp: , Rfl: 1 .  Calcium Carb-Cholecalciferol (CALTRATE 600+D) 600-800 MG-UNIT TABS, Take 1 tablet by mouth daily., Disp: , Rfl:  .  cholecalciferol (VITAMIN D) 1000 UNITS tablet, Take 1,000 Units by mouth daily., Disp: , Rfl:  .  conjugated estrogens (PREMARIN) vaginal cream, 1/2  gram vaginally twice weekly, Disp: 30 g, Rfl: 2 .  Cranberry 400 MG CAPS, Take by mouth daily., Disp: , Rfl:  .  cyclobenzaprine (FLEXERIL) 10 MG tablet, TAKE 1 TABLET EVERY DAY IF NEEDED, Disp: , Rfl: 0 .  FIBER PO, Take by mouth daily., Disp: , Rfl:  .  hydrochlorothiazide (HYDRODIURIL) 25 MG tablet, TAKE 1 TABLET BY MOUTH EVERY DAY IN THE MORNING, Disp: , Rfl: 1 .  ibuprofen (ADVIL,MOTRIN) 400 MG tablet, Take 400 mg by mouth every 6 (six) hours as needed., Disp: , Rfl:  .  Magnesium 250 MG TABS, Take by mouth daily., Disp: , Rfl:  .  meloxicam (MOBIC) 15 MG tablet, TAKE 1 TABLET BY MOUTH EVERY DAY, Disp: 90 tablet, Rfl: 0 .  Multiple Vitamin (MULTIVITAMIN) tablet, Take 1 tablet by mouth daily., Disp: , Rfl:  .  Omega-3 Fatty Acids (FISH OIL PO), Take 1 capsule by mouth 2 (two) times daily. , Disp: , Rfl:  .  pantoprazole (PROTONIX) 40 MG tablet, Take 1 tablet by mouth daily., Disp: , Rfl: 3 .  UNABLE TO FIND, Med Name: Tumeric, Disp: , Rfl:   Social History   Tobacco Use  Smoking Status Former Smoker  . Last attempt to quit: 03/19/2000  . Years since quitting: 17.6  Smokeless  Tobacco Never Used    Allergies  Allergen Reactions  . Amoxicillin-Pot Clavulanate     REACTION: rash  . Ceclor [Cefaclor]     REACTION: rash  . Codeine     REACTION: nausea  . Keflex [Cephalexin]     itching   Objective:  There were no vitals filed for this visit. There is no height or weight on file to calculate BMI. Constitutional Well developed. Well nourished.  Vascular Dorsalis pedis pulses palpable bilaterally. Posterior tibial pulses palpable bilaterally. Capillary refill normal to all digits.  No cyanosis or clubbing noted. Pedal hair growth normal.  Neurologic Normal speech. Oriented to person, place, and time. Epicritic sensation to light touch grossly present bilaterally.  Dermatologic Hallux nails bilaterally dystrophic,ingrowing with pain to palpation No open wounds. No skin  lesions.  Orthopedic: Normal joint ROM without pain or crepitus bilaterally, except for left first MPJ with crepitus and pain on attempted range of motion No visible deformities. No bony tenderness. No pain at the ATFL today   Radiographs: None today Assessment:   1. Hallux limitus of left foot   2. Ankle instability, left   3. Tendonitis   4. Ingrown nail   5. Synovitis and tenosynovitis    Plan:  Patient was evaluated and treated and all questions answered.  Ankle Instability L -Has completed physical therapy.  Only having occasional pain.  Advised to continue exercises as needed.  L Hallux Limitus -Injection again today. -Discussed that she can receive no more injections next time if she is still having pain I advised we should plan for surgery.  Patient knows that she wants to have surgery just does not want to have it performed at this time.  Procedure: Joint Injection Location: Left 1st MPJ joint Skin Prep: Alcohol. Injectate: 0.5 cc 1% lidocaine plain, 0.5 cc dexamethasone phosphate. Disposition: Patient tolerated procedure well. Injection site dressed with a band-aid.  Onychomycosis Bilat, Ingrowing Nails -Bilateral hallux nails debrided secondary to pain  Return in about 6 weeks (around 12/19/2017) for hallux rigidus.

## 2017-11-12 ENCOUNTER — Other Ambulatory Visit: Payer: Self-pay | Admitting: Podiatry

## 2017-11-12 DIAGNOSIS — M205X2 Other deformities of toe(s) (acquired), left foot: Secondary | ICD-10-CM

## 2017-12-19 ENCOUNTER — Ambulatory Visit: Payer: BLUE CROSS/BLUE SHIELD | Admitting: Podiatry

## 2018-01-03 ENCOUNTER — Ambulatory Visit: Payer: BLUE CROSS/BLUE SHIELD | Admitting: Podiatry

## 2018-01-03 ENCOUNTER — Ambulatory Visit (INDEPENDENT_AMBULATORY_CARE_PROVIDER_SITE_OTHER): Payer: BLUE CROSS/BLUE SHIELD

## 2018-01-03 DIAGNOSIS — M7752 Other enthesopathy of left foot: Secondary | ICD-10-CM | POA: Diagnosis not present

## 2018-01-03 DIAGNOSIS — M205X2 Other deformities of toe(s) (acquired), left foot: Secondary | ICD-10-CM | POA: Diagnosis not present

## 2018-01-03 DIAGNOSIS — M19079 Primary osteoarthritis, unspecified ankle and foot: Secondary | ICD-10-CM

## 2018-01-03 DIAGNOSIS — M659 Synovitis and tenosynovitis, unspecified: Secondary | ICD-10-CM

## 2018-01-03 NOTE — Patient Instructions (Signed)
Pre-Operative Instructions  Congratulations, you have decided to take an important step towards improving your quality of life.  You can be assured that the doctors and staff at [facility] will be with you every step of the way.  Here are some important things you should know:  1. Plan to be at the surgery center/hospital at least 1 (one) hour prior to your scheduled time, unless otherwise directed by the surgical center/hospital staff.  You must have a responsible adult accompany you, remain during the surgery and drive you home.  Make sure you have directions to the surgical center/hospital to ensure you arrive on time. 2. If you are having surgery at [facility] or [facility], you will need a copy of your medical history and physical form from your family physician within one month prior to the date of surgery. We will give you a form for your primary physician to complete.  3. We make every effort to accommodate the date you request for surgery.  However, there are times where surgery dates or times have to be moved.  We will contact you as soon as possible if a change in schedule is required.   4. No aspirin/ibuprofen for one week before surgery.  If you are on aspirin, any non-steroidal anti-inflammatory medications (Mobic, Aleve, Ibuprofen) should not be taken seven (7) days prior to your surgery.  You make take Tylenol for pain prior to surgery.  5. Medications - If you are taking daily heart and blood pressure medications, seizure, reflux, allergy, asthma, anxiety, pain or diabetes medications, make sure you notify the surgery center/hospital before the day of surgery so they can tell you which medications you should take or avoid the day of surgery. 6. No food or drink after midnight the night before surgery unless directed otherwise by surgical center/hospital staff. 7. No alcoholic beverages 24-hours prior to surgery.  No smoking 24-hours prior or 24-hours after  surgery. 8. Wear loose pants or shorts. They should be loose enough to fit over bandages, boots, and casts. 9. Don't wear slip-on shoes. Sneakers are preferred. 10. Bring your boot with you to the surgery center/hospital.  Also bring crutches or a walker if your physician has prescribed it for you.  If you do not have this equipment, it will be provided for you after surgery. 11. If you have not been contacted by the surgery center/hospital by the day before your surgery, call to confirm the date and time of your surgery. 12. Leave-time from work may vary depending on the type of surgery you have.  Appropriate arrangements should be made prior to surgery with your employer. 13. Prescriptions will be provided immediately following surgery by your doctor.  Fill these as soon as possible after surgery and take the medication as directed. Pain medications will not be refilled on weekends and must be approved by the doctor. 14. Remove nail polish on the operative foot and avoid getting pedicures prior to surgery. 15. Wash the night before surgery.  The night before surgery wash the foot and leg well with water and the antibacterial soap provided. Be sure to pay special attention to beneath the toenails and in between the toes.  Wash for at least three (3) minutes. Rinse thoroughly with water and dry well with a towel.  Perform this wash unless told not to do so by your physician.  Enclosed: 1 Ice pack (please put in freezer the night before surgery)   1 Hibiclens skin cleaner     Pre-op instructions  If you have any questions regarding the instructions, please do not hesitate to call our office.  [place]: [address], , [place] [address] -- [phone]  [employer] [place]: [address] [place], [place] [address] -- [phone]  [place]: [address]  [place], [place] [address] -- [phone]  [place]: [address], [address], [place], [place] [address] -- [phone]  Website:  [email or web address] 

## 2018-01-16 NOTE — Progress Notes (Signed)
Subjective:  Patient ID: Alexandria Mercado, female    DOB: 04-Dec-1955,  MRN: [id number]  Chief Complaint  Patient presents with  . Foot Pain    left - still stiff and sore - knows she has to think about surgical correction    62 y.o. female presents with the above complaint.  States the left great toe still stiff and sore and would like to discuss surgery as discussed last visit.  Injections have helped but she has maximized these.   Review of Systems: Negative except as noted in the HPI. Denies N/V/F/Ch.  Past Medical History:  Diagnosis Date  . Anxiety   . Chronic RLQ pain 1989   secondary to adhesions  . Depression    grief counseling  . Diverticulosis of colon (without mention of hemorrhage)   . Esophageal obstruction   . Esophageal ring   . GERD with stricture   . Ingrown nail 2018  . PONV (postoperative nausea and vomiting)   . Premature menopause age 51   On HRT age 80 - 2007  . Smoker   . Sprain of right knee 09/2016  . Trigger thumb of right hand 09/2016  . Vitamin D deficiency     Current Outpatient Medications:  .  ALPRAZolam (XANAX) 0.5 MG tablet, Take 0.5 mg by mouth every 8 (eight) hours as needed. for anxiety, Disp: , Rfl: 1 .  Calcium Carb-Cholecalciferol (CALTRATE 600+D) 600-800 MG-UNIT TABS, Take 1 tablet by mouth daily., Disp: , Rfl:  .  cholecalciferol (VITAMIN D) 1000 UNITS tablet, Take 1,000 Units by mouth daily., Disp: , Rfl:  .  conjugated estrogens (PREMARIN) vaginal cream, 1/2 gram vaginally twice weekly, Disp: 30 g, Rfl: 2 .  Cranberry 400 MG CAPS, Take by mouth daily., Disp: , Rfl:  .  cyclobenzaprine (FLEXERIL) 10 MG tablet, TAKE 1 TABLET EVERY DAY IF NEEDED, Disp: , Rfl: 0 .  FIBER PO, Take by mouth daily., Disp: , Rfl:  .  hydrochlorothiazide (HYDRODIURIL) 25 MG tablet, TAKE 1 TABLET BY MOUTH EVERY DAY IN THE MORNING, Disp: , Rfl: 1 .  ibuprofen (ADVIL,MOTRIN) 400 MG tablet, Take 400 mg by mouth every 6 (six) hours as needed., Disp: , Rfl:  .   Magnesium 250 MG TABS, Take by mouth daily., Disp: , Rfl:  .  meloxicam (MOBIC) 15 MG tablet, TAKE 1 TABLET BY MOUTH EVERY DAY, Disp: 90 tablet, Rfl: 0 .  Multiple Vitamin (MULTIVITAMIN) tablet, Take 1 tablet by mouth daily., Disp: , Rfl:  .  Omega-3 Fatty Acids (FISH OIL PO), Take 1 capsule by mouth 2 (two) times daily. , Disp: , Rfl:  .  pantoprazole (PROTONIX) 40 MG tablet, Take 1 tablet by mouth daily., Disp: , Rfl: 3 .  UNABLE TO FIND, Med Name: Tumeric, Disp: , Rfl:   Social History   Tobacco Use  Smoking Status Former Smoker  . Last attempt to quit: 03/19/2000  . Years since quitting: 17.8  Smokeless Tobacco Never Used    Allergies  Allergen Reactions  . Amoxicillin-Pot Clavulanate     REACTION: rash  . Ceclor [Cefaclor]     REACTION: rash  . Codeine     REACTION: nausea  . Keflex [Cephalexin]     itching   Objective:  There were no vitals filed for this visit. There is no height or weight on file to calculate BMI. Constitutional Well developed. Well nourished.  Vascular Dorsalis pedis pulses palpable bilaterally. Posterior tibial pulses palpable bilaterally. Capillary refill normal to all  digits.  No cyanosis or clubbing noted. Pedal hair growth normal.  Neurologic Normal speech. Oriented to person, place, and time. Epicritic sensation to light touch grossly present bilaterally.  Dermatologic Hallux nails bilaterally dystrophic,ingrowing with pain to palpation No open wounds. No skin lesions.  Orthopedic: Normal joint ROM without pain or crepitus bilaterally, except for left first MPJ with crepitus and pain on attempted range of motion No visible deformities. No bony tenderness. No pain at the ATFL today   Radiographs: None today Assessment:   1. Hallux limitus of left foot   2. Synovitis and tenosynovitis   3. Capsulitis of metatarsophalangeal (MTP) joint of left foot   4. Arthritis of big toe    Plan:  Patient was evaluated and treated and all  questions answered.  L Hallux Limitus -Discussed plan for surgery with patient.  Patient would like to correct her painful joint discuss correction of deformity with [facility] arthroplasty with implant.  Surgical plan and postop course explained at length. -Patient has failed all conservative therapy and wishes to proceed with surgical intervention. All risks, benefits, and alternatives discussed with patient. No guarantees given. Consent reviewed and signed by patient. -Planned procedures: L [facility] bunionectomy with implant  Return for post op.

## 2018-02-10 ENCOUNTER — Telehealth: Payer: Self-pay | Admitting: *Deleted

## 2018-02-10 NOTE — Telephone Encounter (Signed)
"  I'm a patient of Dr. March Rummage.  I'm scheduled for surgery in January.  I don't remember the date.  My insurance is changing and you all are not in-network.  So, I'm going to need to cancel that surgery."

## 2018-02-17 NOTE — Telephone Encounter (Signed)
"  Please give me a call back.  I need to cancel a January surgery."

## 2018-02-18 NOTE — Telephone Encounter (Signed)
"  I'm calling to make sure you got my message.  I want to cancel my surgery for January."  What date are you scheduled for in January?  "I think I'm scheduled for January 8th or 9th."  You're scheduled for March 26, 2018 with Dr. March Rummage.  "Yes, that's right."  Is there a reason why you are cancelling.  "My insurance will be changing in January to [facility].  I called and they said you all are not in-network.  So, I will post-pone it.  Do you all accept Medicare?"  Yes, we do.  "I'll just wait a few months and I'll reschedule it once I turn 42."  Okay, I will cancel the appointment.

## 2018-02-20 ENCOUNTER — Other Ambulatory Visit: Payer: Self-pay

## 2018-02-20 MED ORDER — PANTOPRAZOLE SODIUM 40 MG PO TBEC: 40.0000 mg | DELAYED_RELEASE_TABLET | Freq: Every day | ORAL | 0 refills | Status: DC

## 2018-02-20 NOTE — Telephone Encounter (Signed)
Thank you for letting me know

## 2018-02-21 ENCOUNTER — Other Ambulatory Visit: Payer: Self-pay | Admitting: Internal Medicine

## 2018-02-23 ENCOUNTER — Other Ambulatory Visit: Payer: Self-pay | Admitting: Podiatry

## 2018-02-26 ENCOUNTER — Ambulatory Visit: Payer: BLUE CROSS/BLUE SHIELD | Admitting: Certified Nurse Midwife

## 2018-02-26 ENCOUNTER — Other Ambulatory Visit: Payer: Self-pay

## 2018-02-26 ENCOUNTER — Encounter: Payer: Self-pay | Admitting: Certified Nurse Midwife

## 2018-02-26 VITALS — BP 140/80 | HR 72 | Resp 16 | Wt 216.0 lb

## 2018-02-26 DIAGNOSIS — Z87898 Personal history of other specified conditions: Secondary | ICD-10-CM

## 2018-02-26 DIAGNOSIS — N898 Other specified noninflammatory disorders of vagina: Secondary | ICD-10-CM

## 2018-02-26 DIAGNOSIS — A6000 Herpesviral infection of urogenital system, unspecified: Secondary | ICD-10-CM

## 2018-02-26 LAB — POCT URINALYSIS DIPSTICK
Bilirubin, UA: NEGATIVE
GLUCOSE UA: NEGATIVE
KETONES UA: NEGATIVE
LEUKOCYTES UA: NEGATIVE
Nitrite, UA: NEGATIVE
Protein, UA: NEGATIVE
RBC UA: NEGATIVE
UROBILINOGEN UA: NEGATIVE U/dL — AB
pH, UA: 5 (ref 5.0–8.0)

## 2018-02-26 MED ORDER — VALACYCLOVIR HCL 1 G PO TABS: ORAL_TABLET | ORAL | 1 refills | Status: DC

## 2018-02-26 NOTE — Patient Instructions (Signed)

## 2018-02-26 NOTE — Progress Notes (Signed)
62 y.o. Married Caucasian female G0P0 here with complaint of vaginal symptoms of  burning, and feels rawness at very top of vulva. No increase in  discharge. Some vaginal odor, doesn't have itching. Feels she has had this before, but can't remember specifics.  Onset of symptoms 7 days ago. Denies new personal products or ? vaginal dryness. Uses always wipes with no issues.  Urinary symptoms none. No other health issues today.   Review of Systems  Constitutional: Negative.   HENT: Negative.   Eyes: Negative.   Respiratory: Negative.   Cardiovascular: Negative.   Gastrointestinal: Negative.   Genitourinary:       Vaginal cut  Musculoskeletal: Negative.   Skin: Negative.   Neurological: Negative.   Endo/Heme/Allergies: Negative.   Psychiatric/Behavioral: Negative.     O:Healthy female WDWN Affect: normal, orientation x 3  Exam:Skin: warm and dry Abdomen:soft, non tender Inguinal Lymph nodes: no enlargement or tenderness Pelvic exam: External genital: normal female, HSV appearing blister noted at Clitoral hood area, slight redness around area. Shown area to patient BUS: negative Vagina:  Scant slightly odorous discharge noted.    Affirm taken Cervix: normal, non tender, no CMT Uterus: normal, non tender Adnexa:normal, non tender, no masses or fullness noted  Poct urine-neg  A:Normal pelvic exam HSV appearing blister noted  ? Reoccurrence or new diagnosis R/O vaginal infection  P:Discussed findings of HSV blister and etiology. Discussed Aveeno sitz bath for comfort.  Discussed treatment with Valtrex and use of hydrocortisone cream (OTC) to area bid for comfort and healing. Patient remembers being treated with Valtrex a few years ago now and it was for herpes. Has not taken medication for since then. Questions addressed and discussed reoccurrence and treatment with same medication. Rx Valtrex 1000 mg see order with instructions. Lab: Herpes culture Lab Affirm will treat  If  indicated.  Rv prn

## 2018-02-27 LAB — VAGINITIS/VAGINOSIS, DNA PROBE
Candida Species: NEGATIVE
GARDNERELLA VAGINALIS: NEGATIVE
TRICHOMONAS VAG: NEGATIVE

## 2018-02-28 ENCOUNTER — Telehealth: Payer: Self-pay

## 2018-02-28 NOTE — Telephone Encounter (Signed)
Patient notified of results as written by provider 

## 2018-02-28 NOTE — Telephone Encounter (Signed)
-----   Message from Regina Eck, CNM sent at 02/28/2018  8:25 AM EST ----- Notify patient her vaginal screening for yeast(candida), Bacterial vaginitis (gardnerella) and Trichomonas was negative. HSV culture pending. Patient status with Valtrex use?

## 2018-02-28 NOTE — Telephone Encounter (Signed)
Left message for call back.

## 2018-02-28 NOTE — Telephone Encounter (Signed)
Patient returned call to [name]. °

## 2018-02-28 NOTE — Telephone Encounter (Signed)
Left message to call back  

## 2018-03-01 LAB — HERPES SIMPLEX VIRUS CULTURE

## 2018-03-01 LAB — SPECIMEN STATUS REPORT

## 2018-03-06 ENCOUNTER — Telehealth: Payer: Self-pay | Admitting: Certified Nurse Midwife

## 2018-03-06 NOTE — Telephone Encounter (Signed)
Spoke with patient, advised as seen below. Patient thankful for return call, will await return call.

## 2018-03-06 NOTE — Telephone Encounter (Signed)
Message left to return call to Triage Nurse at [phone].   Need to advise patient that [facility] is investigating "cancelled" specimen. Will call patient back with final update once our office hears from [place].

## 2018-03-06 NOTE — Telephone Encounter (Signed)
Patient sent the following correspondence through [facility]. Routing to triage to assist patient with request.  I don't understand what canceled means on my lab result. Did the test get canceled or is that the result?  Thanks,  Alexandria Mercado

## 2018-03-10 NOTE — Telephone Encounter (Signed)
Patient calling to follow up to see if there was any update from [place].

## 2018-03-10 NOTE — Telephone Encounter (Signed)
Spoke with patient. Advised HSV culture unable to be completed due to lab error. Lab cancelled, patient will not be charged. Patient reports area of concern has healed, has RX for Valtrex.   Advised patient to return call to office if any new outbreaks, can schedule OV for retesting at that time, if needed. Advised Melvia Heaps, CNM is out of the office this week, covering provider will review, our office will return call if any additional recommendations. Patient verbalizes understanding and thankful for f/u.   Routing to provider for final review. Patient is agreeable to disposition. Will close encounter.

## 2018-03-13 ENCOUNTER — Ambulatory Visit: Payer: BLUE CROSS/BLUE SHIELD | Admitting: Podiatry

## 2018-03-13 DIAGNOSIS — M205X2 Other deformities of toe(s) (acquired), left foot: Secondary | ICD-10-CM | POA: Diagnosis not present

## 2018-03-13 DIAGNOSIS — M7752 Other enthesopathy of left foot: Secondary | ICD-10-CM | POA: Diagnosis not present

## 2018-03-13 DIAGNOSIS — M19079 Primary osteoarthritis, unspecified ankle and foot: Secondary | ICD-10-CM

## 2018-03-13 DIAGNOSIS — M79676 Pain in unspecified toe(s): Secondary | ICD-10-CM | POA: Diagnosis not present

## 2018-03-13 DIAGNOSIS — L600 Ingrowing nail: Secondary | ICD-10-CM

## 2018-03-16 ENCOUNTER — Other Ambulatory Visit: Payer: Self-pay | Admitting: Internal Medicine

## 2018-03-20 ENCOUNTER — Other Ambulatory Visit: Payer: Self-pay | Admitting: Certified Nurse Midwife

## 2018-03-20 DIAGNOSIS — A6000 Herpesviral infection of urogenital system, unspecified: Secondary | ICD-10-CM

## 2018-03-20 NOTE — Telephone Encounter (Signed)
Message left to return call to [facility] at [phone].   Prescription for Valtrex sent on 02-26-18, #30, 1RF. Need patient to advise if she does in fact need refill.

## 2018-03-20 NOTE — Telephone Encounter (Signed)
Patient returned call. States she does not need a refill of valtrex at this time. RN advised would update pharmacy. Patient agreeable.

## 2018-03-31 ENCOUNTER — Telehealth: Payer: Self-pay | Admitting: Internal Medicine

## 2018-03-31 NOTE — Telephone Encounter (Signed)
Patient with constipation and ? Fecal impaction.  Urgent care turned her away.  She reports that she is straining and having small hard stools, but unable to get some relief.  She has taken OTC Miralax and senokot with very little results.  She just did a fleet enema at home and so far has had no relief ( in the last 10 minutes).  She is advised to try 6-8 glasses of Miralax q 15 minutes, then resume Miralax daily tomorrow.  She will call back if she has continued constipation.

## 2018-03-31 NOTE — Telephone Encounter (Signed)
Pt would like to speak with a nurse, she stated that she has a fecal infection and is very constipated, she wants some advise.

## 2018-04-01 NOTE — Telephone Encounter (Signed)
Patient had some results from the purge yesterday, but still feels she has a lot of stool.  She will add dulcolax and repeat Miralax purge.  Her insurance has changed and she is not covered to come here.  She declines office visit for now.  She will call back for additional concern.

## 2018-04-01 NOTE — Telephone Encounter (Signed)
Patient requesting to speak with [facility]. Patient states she is doing miralax every 15 minutes and not getting any results. Pt states she is really "backed up" and wants to know what to do.

## 2018-04-08 ENCOUNTER — Telehealth: Payer: Self-pay | Admitting: Internal Medicine

## 2018-04-08 NOTE — Telephone Encounter (Signed)
Left message for patient to call back  

## 2018-04-08 NOTE — Telephone Encounter (Signed)
Patient went to the ED in [facility].  She is asking if she needs to continue TID Miralax.  She will decrease to BID if she is having cramping. She is advised that if she has continued constipation, and this is a change,  she should make an appt for evaluation.

## 2018-04-08 NOTE — Telephone Encounter (Signed)
PT would like to speak to you regarding her resent visit to the ER

## 2018-04-13 NOTE — Progress Notes (Signed)
Subjective:  Patient ID: Alexandria Mercado, female    DOB: [birth year],  MRN: [id number]  Chief Complaint  Patient presents with  . Toe Pain    left great toe pain    63 y.o. female presents with the above complaint.  Had a change of insurance and cannot proceed with surgery at this time complains of ingrown toenails of the left great toe  Review of Systems: Negative except as noted in the HPI. Denies N/V/F/Ch.  Past Medical History:  Diagnosis Date  . Anxiety   . Chronic RLQ pain 1989   secondary to adhesions  . Depression    grief counseling  . Diverticulosis of colon (without mention of hemorrhage)   . Esophageal obstruction   . Esophageal ring   . GERD with stricture   . Hypertension   . Ingrown nail 2018  . Pneumonia   . PONV (postoperative nausea and vomiting)   . Premature menopause age 61   On HRT age 31 - 2007  . Smoker   . Sprain of right knee 09/2016  . Trigger thumb of right hand 09/2016  . Vitamin D deficiency     Current Outpatient Medications:  .  ALPRAZolam (XANAX) 1 MG tablet, Takes 1/2 prn, Disp: , Rfl: 0 .  Calcium Carb-Cholecalciferol (CALTRATE 600+D) 600-800 MG-UNIT TABS, Take 1 tablet by mouth daily., Disp: , Rfl:  .  cholecalciferol (VITAMIN D) 1000 UNITS tablet, Take 1,000 Units by mouth daily., Disp: , Rfl:  .  Cranberry 400 MG CAPS, Take by mouth daily., Disp: , Rfl:  .  cyclobenzaprine (FLEXERIL) 10 MG tablet, TAKE 1 TABLET EVERY DAY IF NEEDED, Disp: , Rfl: 0 .  FIBER PO, Take by mouth daily., Disp: , Rfl:  .  hydrochlorothiazide (HYDRODIURIL) 25 MG tablet, TAKE 1 TABLET BY MOUTH EVERY DAY IN THE MORNING, Disp: , Rfl: 1 .  ibuprofen (ADVIL,MOTRIN) 400 MG tablet, Take 400 mg by mouth every 6 (six) hours as needed., Disp: , Rfl:  .  meloxicam (MOBIC) 15 MG tablet, TAKE 1 TABLET BY MOUTH EVERY DAY (Patient taking differently: as needed. ), Disp: 90 tablet, Rfl: 0 .  Multiple Vitamin (MULTIVITAMIN) tablet, Take 1 tablet by mouth daily., Disp: , Rfl:    .  pantoprazole (PROTONIX) 40 MG tablet, TAKE 1 TABLET BY MOUTH EVERY DAY, Disp: 30 tablet, Rfl: 0 .  UNABLE TO FIND, Ultimate eye support, Disp: , Rfl:  .  valACYclovir (VALTREX) 1000 MG tablet, Take one tablet daily x 3 days at onset of outbreak, Disp: 30 tablet, Rfl: 1  Social History   Tobacco Use  Smoking Status Former Smoker  . Last attempt to quit: 03/19/2000  . Years since quitting: 18.0  Smokeless Tobacco Never Used    Allergies  Allergen Reactions  . Amoxicillin-Pot Clavulanate     REACTION: rash  . Ceclor [Cefaclor]     REACTION: rash  . Codeine     REACTION: nausea  . Doxycycline     Slight itching  . Keflex [Cephalexin]     itching   Objective:  There were no vitals filed for this visit. There is no height or weight on file to calculate BMI. Constitutional Well developed. Well nourished.  Vascular Dorsalis pedis pulses palpable bilaterally. Posterior tibial pulses palpable bilaterally. Capillary refill normal to all digits.  No cyanosis or clubbing noted. Pedal hair growth normal.  Neurologic Normal speech. Oriented to person, place, and time. Epicritic sensation to light touch grossly present bilaterally.  Dermatologic Hallux  nails bilaterally dystrophic,ingrowing with pain to palpation No open wounds. No skin lesions.  Orthopedic: Normal joint ROM without pain or crepitus bilaterally, except for left first MPJ with crepitus and pain on attempted range of motion No visible deformities. No bony tenderness. No pain at the ATFL today   Radiographs: None today Assessment:   1. Hallux limitus of left foot   2. Capsulitis of metatarsophalangeal (MTP) joint of left foot   3. Arthritis of big toe   4. Pain around toenail   5. Ingrown nail    Plan:  Patient was evaluated and treated and all questions answered.  L Hallux Limitus -Has appointment postponed surgery due to insurance issues for right now however the area is not causing too much  pain.  Left great toe ingrown toenail -Debrided today and slant back fashion to patient relief follow-up should the nail cause any issues.  No follow-ups on file.

## 2018-04-17 ENCOUNTER — Other Ambulatory Visit: Payer: Self-pay | Admitting: Internal Medicine

## 2018-05-12 ENCOUNTER — Other Ambulatory Visit: Payer: Self-pay

## 2018-05-12 MED ORDER — PANTOPRAZOLE SODIUM 40 MG PO TBEC: 40.0000 mg | DELAYED_RELEASE_TABLET | Freq: Every day | ORAL | 5 refills | Status: DC

## 2018-05-12 NOTE — Telephone Encounter (Signed)
Pantoprazole refilled as pharmacy requested. 

## 2018-05-13 ENCOUNTER — Other Ambulatory Visit: Payer: Self-pay | Admitting: Obstetrics & Gynecology

## 2018-05-13 DIAGNOSIS — Z1231 Encounter for screening mammogram for malignant neoplasm of breast: Secondary | ICD-10-CM

## 2018-05-26 ENCOUNTER — Ambulatory Visit: Payer: BLUE CROSS/BLUE SHIELD | Admitting: Nurse Practitioner

## 2018-06-16 ENCOUNTER — Telehealth: Payer: Self-pay | Admitting: Obstetrics & Gynecology

## 2018-06-16 NOTE — Telephone Encounter (Signed)
Left message to call Yeison Sippel, RN at [facility] [phone].   

## 2018-06-16 NOTE — Telephone Encounter (Signed)
Patient is having some "nipple pain" and is scheduled for her MMG in August 2020 at [place].  She can have her MMG earlier at [facility] in [place], [place]

## 2018-06-16 NOTE — Telephone Encounter (Signed)
Spoke with patient. Reports intermittent nipple pain on right breast with redness and burning sensation. Started approximately 2 wks ago. Denies any symptoms today. Denies fever/chills, lumps, nipple d/c. Currently scheduled for screening MMG at [facility] 10/2018. Patient asking if she should move her MMG up?   Advised OV recommended for further evaluation to determine if additional imaging is needed. Patient declines OV at this time, states if symptoms return she will return call to office to schedule OV.   Patient has a new Buyer, retail, has additional questions about cost of OV, transferred to front office staff for further assistance.   Routing to covering provider for final review. Patient is agreeable to disposition. Will close encounter.  Cc: Dr. Sabra Heck

## 2018-07-23 ENCOUNTER — Other Ambulatory Visit: Payer: Self-pay | Admitting: Certified Nurse Midwife

## 2018-07-23 DIAGNOSIS — A6000 Herpesviral infection of urogenital system, unspecified: Secondary | ICD-10-CM

## 2018-07-23 MED ORDER — VALACYCLOVIR HCL 500 MG PO TABS: 500.0000 mg | ORAL_TABLET | Freq: Two times a day (BID) | ORAL | 1 refills | Status: DC

## 2018-07-23 MED ORDER — VALACYCLOVIR HCL 500 MG PO TABS: ORAL_TABLET | ORAL | 1 refills | Status: DC

## 2018-07-23 NOTE — Telephone Encounter (Signed)
Medication refill request: valtrex  Last AEX:  06/11/17 SM Next AEX: 12/26/18 SM Last MMG (if hormonal medication request): 07/10/17 BIRADS1:Neg. Has appt 10/29/18  Refill authorized: 02/26/18 #[id number]. Today please advise.   Routed to [employer]

## 2018-07-23 NOTE — Telephone Encounter (Signed)
Spoke with patient, advised as seen below per Dr. Talbert Nan. Patient request Rx for Valtrex 500 mg bid for 3 days prn, Rx sent to verified pharmacy. Patient verbalizes understanding, thankful for call.   Routing to provider for final review. Patient is agreeable to disposition. Will close encounter.

## 2018-07-23 NOTE — Telephone Encounter (Signed)
Please let the patient know that for genital HSV the correct dosing is 500 mg BID for 3 days or 1000 mg a day for 5 days. Please see how she would like to take it and send in the appropriate script, #30, 1 refill

## 2018-08-05 ENCOUNTER — Other Ambulatory Visit: Payer: Self-pay | Admitting: Obstetrics and Gynecology

## 2018-08-21 ENCOUNTER — Encounter

## 2018-08-21 ENCOUNTER — Ambulatory Visit: Payer: BLUE CROSS/BLUE SHIELD | Admitting: Obstetrics & Gynecology

## 2018-10-20 ENCOUNTER — Other Ambulatory Visit: Payer: Self-pay

## 2018-10-20 MED ORDER — PANTOPRAZOLE SODIUM 40 MG PO TBEC: 40.0000 mg | DELAYED_RELEASE_TABLET | Freq: Every day | ORAL | 5 refills | Status: DC

## 2018-10-20 NOTE — Telephone Encounter (Signed)
Pantoprazole refilled as pharmacy requested. 

## 2018-10-23 ENCOUNTER — Other Ambulatory Visit: Payer: Self-pay

## 2018-10-23 ENCOUNTER — Ambulatory Visit (INDEPENDENT_AMBULATORY_CARE_PROVIDER_SITE_OTHER): Payer: Medicare Other | Admitting: Obstetrics & Gynecology

## 2018-10-23 ENCOUNTER — Encounter: Payer: Self-pay | Admitting: Obstetrics & Gynecology

## 2018-10-23 VITALS — BP 136/80 | HR 84 | Temp 96.9°F | Ht 67.5 in | Wt 210.0 lb

## 2018-10-23 DIAGNOSIS — N644 Mastodynia: Secondary | ICD-10-CM

## 2018-10-23 NOTE — Progress Notes (Signed)
GYNECOLOGY  VISIT  CC:   Right breast pain/nipple pain  HPI: 63 y.o. G0P0 Married White or Caucasian female here for complaint of intermittent right breast pain/nipple pain that has been present since march.  Has undergone a breast biopsy in the past in the same area.  She does have a MMG scheduled already on 10/29/2018 but is concerned she may need a diagnostic MMG as well.  Denies trauma.  Denies nipple discharge or any new masses.  06/11/2006 she had a breast excision after having some nipple discharge.  She had a ductogram and then the procedure.  This was done with Dr. Annamaria Boots.  Pathology showed: BREAST, RIGHT, EXCISION OF DUCT SYSTEM:  - BENIGN INTRADUCTAL PAPILLOMA.  - PROMINENT DUCT ECTASIA WITH FOCAL PAPILLOMATOUS CHANGE.  - CYST FORMATION WITH APOCRINE METAPLASIA.  - NO ATYPIA OR EVIDENCE OF MALIGNANCY IDENTIFIED.  - MARGINS NEGATIVE.   GYNECOLOGIC HISTORY: Patient's last menstrual period was 09/17/1990 (approximate). Contraception: PMP Menopausal hormone therapy: none  Patient Active Problem List   Diagnosis Date Noted  . Obesity (BMI 30.0-34.9) 01/14/2017  . GERD with stricture   . Trigger finger of right thumb 08/31/2016  . Bilateral hand pain 04/18/2016  . Contracture of palmar fascia 04/18/2016  . Trigger ring finger of left hand 04/18/2016  . Generalized anxiety disorder 05/31/2015  . Vitamin D deficiency 05/31/2015  . Premature ovarian failure 05/31/2015    Past Medical History:  Diagnosis Date  . Anxiety   . Chronic RLQ pain 1989   secondary to adhesions  . Depression    grief counseling  . Diverticulosis of colon (without mention of hemorrhage)   . Esophageal obstruction   . Esophageal ring   . GERD with stricture   . Hypertension   . Ingrown nail 2018  . Pneumonia   . PONV (postoperative nausea and vomiting)   . Premature menopause age 67   On HRT age 33 - 2007  . Smoker   . Sprain of right knee 09/2016  . Trigger thumb of right hand 09/2016   . Vitamin D deficiency     Past Surgical History:  Procedure Laterality Date  . BREAST EXCISIONAL BIOPSY Right 06/11/2006  . CHEILECTOMY Right 9/06   great toe   . COLONOSCOPY  1988   normal  . COLONOSCOPY  9/98   normal  . COLONOSCOPY  11/03   normal  . COLONOSCOPY W/ BIOPSIES  03/2008   mild diverticula - recheck 7 years  . ESOPHAGOGASTRODUODENOSCOPY N/A 11/08/2016   Procedure: ESOPHAGOGASTRODUODENOSCOPY (EGD);  Surgeon: Gatha Mayer, MD;  Location: [facility];  Service: Endoscopy;  Laterality: N/A;  . HYSTEROSCOPY  2003  . LAPAROSCOPIC OVARIAN CYSTECTOMY Right 1985  . MM BREAST STEREO BX*L*R/S Right 06/2006    bloody nipple discharge - path reveals intraductal papilloma Dr. Annamaria Boots  . TRIGGER FINGER RELEASE Right 09/18/2016   Procedure: RELEASE TRIGGER FINGER/A-1 PULLEY RIGHT THUMB;  Surgeon: Daryll Brod, MD;  Location: [facility];  Service: Orthopedics;  Laterality: Right;  REG/FAB    MEDS:   Current Outpatient Medications on File Prior to Visit  Medication Sig Dispense Refill  . ALPRAZolam (XANAX) 1 MG tablet Takes 1/2 prn  0  . Calcium Carb-Cholecalciferol (CALTRATE 600+D) 600-800 MG-UNIT TABS Take 1 tablet by mouth daily.    . cholecalciferol (VITAMIN D) 1000 UNITS tablet Take 1,000 Units by mouth daily.    . Cranberry 400 MG CAPS Take by mouth daily.    . cyclobenzaprine (FLEXERIL) 10 MG  tablet TAKE 1 TABLET EVERY DAY IF NEEDED  0  . FIBER PO Take by mouth daily.    . hydrochlorothiazide (HYDRODIURIL) 25 MG tablet TAKE 1 TABLET BY MOUTH EVERY DAY IN THE MORNING  1  . ibuprofen (ADVIL,MOTRIN) 400 MG tablet Take 400 mg by mouth every 6 (six) hours as needed.    . Multiple Vitamin (MULTIVITAMIN) tablet Take 1 tablet by mouth daily.    . pantoprazole (PROTONIX) 40 MG tablet Take 1 tablet (40 mg total) by mouth daily. 30 tablet 5  . UNABLE TO FIND Ultimate eye support    . valACYclovir (VALTREX) 500 MG tablet Take one tablet po bid x3 days at onset of outbreak  prn. 30 tablet 1   No current facility-administered medications on file prior to visit.     ALLERGIES: Amoxicillin-pot clavulanate, Ceclor [cefaclor], Codeine, Doxycycline, and Keflex [cephalexin]  Family History  Problem Relation Age of Onset  . Colon cancer Maternal Uncle 15  . Uterine cancer Mother   . Hyperlipidemia Mother   . Hypertension Brother   . Heart failure Maternal Grandfather   . Colon cancer Other     SH:  Married, non smoker  Review of Systems  All other systems reviewed and are negative.   PHYSICAL EXAMINATION:    BP 136/80   Pulse 84   Temp (!) 96.9 F (36.1 C) (Temporal)   Ht 5' 7.5" (1.715 m)   Wt 210 lb (95.3 kg)   LMP 09/17/1990 (Approximate)   BMI 32.41 kg/m     Physical Exam  Constitutional: She appears well-developed and well-nourished.  Cardiovascular: Normal rate.  Respiratory: Effort normal and breath sounds normal. Right breast exhibits tenderness. Right breast exhibits no inverted nipple, no mass, no nipple discharge and no skin change. Left breast exhibits no inverted nipple, no mass, no nipple discharge, no skin change and no tenderness. Breasts are symmetrical.    Skin: Skin is warm.  Psychiatric: She has a normal mood and affect.   Chaperone was present for exam.  Assessment: Breast pain H/o lumpectomy with papilloma excision in 2003  Plan: Will schedule diagnostic MMG for this pt instead of screening.  She will be notified of appt.

## 2018-10-23 NOTE — Progress Notes (Signed)
Patient scheduled while in office. Spoke with Clarise Cruz at [facility]. Bilateral Dx MMG and right breast US, if needed, on 10/29/18 at 10am arriving at 9:40am. Patient is agreeable to date and time.

## 2018-10-29 ENCOUNTER — Other Ambulatory Visit: Payer: Self-pay

## 2018-10-29 ENCOUNTER — Ambulatory Visit: Payer: BLUE CROSS/BLUE SHIELD

## 2018-10-29 ENCOUNTER — Ambulatory Visit
Admission: RE | Admit: 2018-10-29 | Discharge: 2018-10-29 | Disposition: A | Discharge status: Home / Self Care | Payer: BLUE CROSS/BLUE SHIELD | Source: Ambulatory Visit | Attending: Obstetrics & Gynecology | Admitting: Obstetrics & Gynecology

## 2018-10-29 DIAGNOSIS — N644 Mastodynia: Secondary | ICD-10-CM

## 2018-10-29 DIAGNOSIS — R928 Other abnormal and inconclusive findings on diagnostic imaging of breast: Secondary | ICD-10-CM | POA: Diagnosis not present

## 2018-11-07 ENCOUNTER — Encounter: Payer: Self-pay | Admitting: Internal Medicine

## 2018-11-10 ENCOUNTER — Ambulatory Visit (INDEPENDENT_AMBULATORY_CARE_PROVIDER_SITE_OTHER): Payer: Medicare Other | Admitting: Nurse Practitioner

## 2018-11-10 ENCOUNTER — Encounter: Payer: Self-pay | Admitting: Nurse Practitioner

## 2018-11-10 VITALS — BP 174/102 | HR 84 | Temp 98.1°F | Ht 68.0 in | Wt 212.0 lb

## 2018-11-10 DIAGNOSIS — K219 Gastro-esophageal reflux disease without esophagitis: Secondary | ICD-10-CM

## 2018-11-10 DIAGNOSIS — Z86010 Personal history of colonic polyps: Secondary | ICD-10-CM | POA: Diagnosis not present

## 2018-11-10 DIAGNOSIS — K5909 Other constipation: Secondary | ICD-10-CM | POA: Diagnosis not present

## 2018-11-10 NOTE — Progress Notes (Signed)
Chief Complaint:  Abdominal pain, constipation    IMPRESSION and PLAN:    1. 63 yo female with chronic constipation with decreased urge and difficult evacuation. Bowel regimen consists of daily fiber and prn miralax, dulcolax and senokot. Says she could do better with water intake.  -increase water intake to 6-8 glasses water daily.  -okay to increase Miralax to once daily. If stools get loose then can try 1/2 capful daily.  -continue fiber  2. Hx of colon polyps. Due for surveillance colonoscopy. She is concerned about wearing mask / oxygenation during procedure. Explained that 02 levels are monitored during the procedure. She is more comfortable waiting until masks no longer required for COVID pandemic.  I went ahead and explained the risks and benefits of colonoscopy with possible polypectomy / biopsies in case she changes her mind.    3. GERD / mild esophagitis / Schatzki's ring on EGD Aug 2018. She has no GERD symptoms. Rare sensation of food sticking in esophagus but believes stress plays a part in that. She would like to come of Protonix.  -can try reducing protonix to 40 mg QOD. If still asymptomatic after ~ 3-4 weeks then can start Pepcid ac QOD. Given 2018 EGD findings I'm hesitant to give discontinuation date right now.    HPI:     Patient is a 63 yo female known to Dr. Carlean Purl for history of GERD with stricture. She also has chronic constipation and a history of adenomatous colon polyps. Received a colonoscopy recall letter in the mail. Alexandria Mercado began having significant problems with constipation sometime around January. She called the office and we tweaked her constipation regimen. She takes Senokot, Miralax and Dulcolax all as needed. She takes Fibermucil on a daily basis. Inquires if okay to take Miralax everyday. She describes constipation as both decreased urge and also difficult evacuation. She also complains of gas and this gets worse after milkshakes.   [name]  would like to come of Protonix. No GERD symptoms, rarely has sensation of food getting stuck in esophagus.     Review of systems:     No chest pain, no SOB, no fevers, no urinary sx   Past Medical History:  Diagnosis Date  . Anxiety   . Chronic RLQ pain 1989   secondary to adhesions  . Depression    grief counseling  . Diverticulosis of colon (without mention of hemorrhage)   . Esophageal obstruction   . Esophageal ring   . GERD with stricture   . Hypertension   . Ingrown nail 2018  . Pneumonia   . PONV (postoperative nausea and vomiting)   . Premature menopause age 79   On HRT age 29 - 2007  . Smoker   . Sprain of right knee 09/2016  . Trigger thumb of right hand 09/2016  . Vitamin D deficiency     Patient's surgical history, family medical history, social history, medications and allergies were all reviewed in Epic   Current Outpatient Medications  Medication Sig Dispense Refill  . ALPRAZolam (XANAX) 1 MG tablet Takes 1/2 prn  0  . Calcium Carb-Cholecalciferol (CALTRATE 600+D) 600-800 MG-UNIT TABS Take 1 tablet by mouth daily.    . cholecalciferol (VITAMIN D) 1000 UNITS tablet Take 1,000 Units by mouth daily.    . Cranberry 400 MG CAPS Take by mouth daily.    . cyclobenzaprine (FLEXERIL) 10 MG tablet TAKE 1 TABLET EVERY DAY IF NEEDED  0  . FIBER PO Take  by mouth daily.    Marland Kitchen glucosamine-chondroitin 500-400 MG tablet Take 1 tablet by mouth daily.    . hydrochlorothiazide (HYDRODIURIL) 25 MG tablet TAKE 1 TABLET BY MOUTH EVERY DAY IN THE MORNING  1  . ibuprofen (ADVIL,MOTRIN) 400 MG tablet Take 400 mg by mouth every 6 (six) hours as needed.    . Multiple Vitamin (MULTIVITAMIN) tablet Take 1 tablet by mouth daily.    . pantoprazole (PROTONIX) 40 MG tablet Take 1 tablet (40 mg total) by mouth daily. 30 tablet 5  . polyethylene glycol (MIRALAX / GLYCOLAX) 17 g packet Take 17 g by mouth as needed.    Marland Kitchen UNABLE TO FIND Ultimate eye support    . valACYclovir (VALTREX) 500 MG  tablet Take one tablet po bid x3 days at onset of outbreak prn. 30 tablet 1   No current facility-administered medications for this visit.     Physical Exam:     BP (!) 174/102   Pulse 84   Temp 98.1 F (36.7 C) (Oral)   Ht 5\' 8"  (1.727 m)   Wt 212 lb (96.2 kg)   LMP 09/17/1990 (Approximate)   BMI 32.23 kg/m   GENERAL:  Pleasant female in NAD PSYCH: : Cooperative, normal affect EENT:  conjunctiva pink, mucous membranes moist, neck supple without masses CARDIAC:  RRR,  no peripheral edema PULM: Normal respiratory effort, lungs CTA bilaterally, no wheezing ABDOMEN:  Nondistended, soft, nontender. No obvious masses, no hepatomegaly,  normal bowel sounds SKIN:  turgor, no lesions seen Musculoskeletal:  Normal muscle tone, normal strength NEURO: Alert and oriented x 3, no focal neurologic deficits   Tye Savoy , NP 11/10/2018, 3:41 PM

## 2018-11-10 NOTE — Patient Instructions (Signed)
If you are age 65 or older, your body mass index should be between 23-30. Your Body mass index is 32.23 kg/m. If this is out of the aforementioned range listed, please consider follow up with your Primary Care Provider.  If you are age 19 or younger, your body mass index should be between 19-25. Your Body mass index is 32.23 kg/m. If this is out of the aformentioned range listed, please consider follow up with your Primary Care Provider.   Decrease Protonix to every other day.  If no symptoms in 4-6 weeks, then change to Pepcid before a meal every other day.  Increase Miralax to twice daily.  Increase water to 8 glasses a day.  Please call back when ready to schedule colonoscopy.  Thank you for choosing me and [facility].   Tye Savoy, NP

## 2018-11-18 DIAGNOSIS — L821 Other seborrheic keratosis: Secondary | ICD-10-CM | POA: Diagnosis not present

## 2018-11-18 DIAGNOSIS — D2372 Other benign neoplasm of skin of left lower limb, including hip: Secondary | ICD-10-CM | POA: Diagnosis not present

## 2018-11-18 DIAGNOSIS — L905 Scar conditions and fibrosis of skin: Secondary | ICD-10-CM | POA: Diagnosis not present

## 2018-11-18 DIAGNOSIS — D225 Melanocytic nevi of trunk: Secondary | ICD-10-CM | POA: Diagnosis not present

## 2018-12-03 DIAGNOSIS — Z0120 Encounter for dental examination and cleaning without abnormal findings: Secondary | ICD-10-CM | POA: Diagnosis not present

## 2018-12-04 ENCOUNTER — Ambulatory Visit (INDEPENDENT_AMBULATORY_CARE_PROVIDER_SITE_OTHER): Payer: Medicare Other

## 2018-12-04 ENCOUNTER — Other Ambulatory Visit: Payer: Self-pay

## 2018-12-04 ENCOUNTER — Ambulatory Visit (INDEPENDENT_AMBULATORY_CARE_PROVIDER_SITE_OTHER): Payer: Medicare Other | Admitting: Podiatry

## 2018-12-04 DIAGNOSIS — M659 Unspecified synovitis and tenosynovitis, unspecified site: Secondary | ICD-10-CM

## 2018-12-04 DIAGNOSIS — M19079 Primary osteoarthritis, unspecified ankle and foot: Secondary | ICD-10-CM

## 2018-12-04 DIAGNOSIS — I10 Essential (primary) hypertension: Secondary | ICD-10-CM | POA: Insufficient documentation

## 2018-12-04 DIAGNOSIS — M7752 Other enthesopathy of left foot: Secondary | ICD-10-CM

## 2018-12-04 DIAGNOSIS — E7800 Pure hypercholesterolemia, unspecified: Secondary | ICD-10-CM | POA: Insufficient documentation

## 2018-12-04 DIAGNOSIS — M205X2 Other deformities of toe(s) (acquired), left foot: Secondary | ICD-10-CM

## 2018-12-04 DIAGNOSIS — L600 Ingrowing nail: Secondary | ICD-10-CM

## 2018-12-04 DIAGNOSIS — M79676 Pain in unspecified toe(s): Secondary | ICD-10-CM

## 2018-12-04 DIAGNOSIS — F419 Anxiety disorder, unspecified: Secondary | ICD-10-CM | POA: Insufficient documentation

## 2018-12-04 DIAGNOSIS — M79672 Pain in left foot: Secondary | ICD-10-CM

## 2018-12-04 NOTE — Patient Instructions (Signed)
Pre-Operative Instructions  Congratulations, you have decided to take an important step towards improving your quality of life.  You can be assured that the doctors and staff at [facility] will be with you every step of the way.  Here are some important things you should know:  1. Plan to be at the surgery center/hospital at least 1 (one) hour prior to your scheduled time, unless otherwise directed by the surgical center/hospital staff.  You must have a responsible adult accompany you, remain during the surgery and drive you home.  Make sure you have directions to the surgical center/hospital to ensure you arrive on time. 2. If you are having surgery at [facility] or [facility], you will need a copy of your medical history and physical form from your family physician within one month prior to the date of surgery. We will give you a form for your primary physician to complete.  3. We make every effort to accommodate the date you request for surgery.  However, there are times where surgery dates or times have to be moved.  We will contact you as soon as possible if a change in schedule is required.   4. No aspirin/ibuprofen for one week before surgery.  If you are on aspirin, any non-steroidal anti-inflammatory medications (Mobic, Aleve, Ibuprofen) should not be taken seven (7) days prior to your surgery.  You make take Tylenol for pain prior to surgery.  5. Medications - If you are taking daily heart and blood pressure medications, seizure, reflux, allergy, asthma, anxiety, pain or diabetes medications, make sure you notify the surgery center/hospital before the day of surgery so they can tell you which medications you should take or avoid the day of surgery. 6. No food or drink after midnight the night before surgery unless directed otherwise by surgical center/hospital staff. 7. No alcoholic beverages 24-hours prior to surgery.  No smoking 24-hours prior or 24-hours after  surgery. 8. Wear loose pants or shorts. They should be loose enough to fit over bandages, boots, and casts. 9. Don't wear slip-on shoes. Sneakers are preferred. 10. Bring your boot with you to the surgery center/hospital.  Also bring crutches or a walker if your physician has prescribed it for you.  If you do not have this equipment, it will be provided for you after surgery. 11. If you have not been contacted by the surgery center/hospital by the day before your surgery, call to confirm the date and time of your surgery. 12. Leave-time from work may vary depending on the type of surgery you have.  Appropriate arrangements should be made prior to surgery with your employer. 13. Prescriptions will be provided immediately following surgery by your doctor.  Fill these as soon as possible after surgery and take the medication as directed. Pain medications will not be refilled on weekends and must be approved by the doctor. 14. Remove nail polish on the operative foot and avoid getting pedicures prior to surgery. 15. Wash the night before surgery.  The night before surgery wash the foot and leg well with water and the antibacterial soap provided. Be sure to pay special attention to beneath the toenails and in between the toes.  Wash for at least three (3) minutes. Rinse thoroughly with water and dry well with a towel.  Perform this wash unless told not to do so by your physician.  Enclosed: 1 Ice pack (please put in freezer the night before surgery)   1 Hibiclens skin cleaner     Pre-op instructions  If you have any questions regarding the instructions, please do not hesitate to call our office.  [place]: [address], [place], [place] [address] -- [phone]  [place]: [address] [place], [place] [address] -- [phone]  [place]: [address]  [place], [place] [address] -- [phone]   Website: [email or web address] 

## 2018-12-05 ENCOUNTER — Other Ambulatory Visit: Payer: Self-pay | Admitting: Podiatry

## 2018-12-05 DIAGNOSIS — M205X2 Other deformities of toe(s) (acquired), left foot: Secondary | ICD-10-CM

## 2018-12-12 ENCOUNTER — Encounter: Payer: Self-pay | Admitting: Internal Medicine

## 2018-12-26 ENCOUNTER — Ambulatory Visit: Payer: Medicare Other | Admitting: Obstetrics & Gynecology

## 2018-12-31 NOTE — Progress Notes (Signed)
Subjective:  Patient ID: Alexandria Mercado, female    DOB: May 08, 1955,  MRN: [id number]  No chief complaint on file.   63 y.o. female presents for follow-up states that the left great toenail is hurting again also having worsening arthritis of the big toe joint.  Thinks this time to talk about surgery.                                                                                                                                                                            Review of Systems: Negative except as noted in the HPI. Denies N/V/F/Ch.  Past Medical History:  Diagnosis Date  . Anxiety   . Chronic RLQ pain 1989   secondary to adhesions  . Depression    grief counseling  . Diverticulosis of colon (without mention of hemorrhage)   . Esophageal obstruction   . Esophageal ring   . GERD with stricture   . Hypertension   . Ingrown nail 2018  . Pneumonia   . PONV (postoperative nausea and vomiting)   . Premature menopause age 2   On HRT age 41 - 2007  . Smoker   . Sprain of right knee 09/2016  . Trigger thumb of right hand 09/2016  . Vitamin D deficiency     Current Outpatient Medications:  .  meloxicam (MOBIC) 15 MG tablet, TAKE 1 TABLET BY MOUTH EVERY DAY, Disp: , Rfl:  .  ALPRAZolam (XANAX) 1 MG tablet, Takes 1/2 prn, Disp: , Rfl: 0 .  Calcium Carb-Cholecalciferol (CALTRATE 600+D) 600-800 MG-UNIT TABS, Take 1 tablet by mouth daily., Disp: , Rfl:  .  cholecalciferol (VITAMIN D) 1000 UNITS tablet, Take 1,000 Units by mouth daily., Disp: , Rfl:  .  Cranberry 400 MG CAPS, Take by mouth daily., Disp: , Rfl:  .  cyclobenzaprine (FLEXERIL) 10 MG tablet, TAKE 1 TABLET EVERY DAY IF NEEDED, Disp: , Rfl: 0 .  FIBER PO, Take by mouth daily., Disp: , Rfl:  .  glucosamine-chondroitin 500-400 MG tablet, Take 1 tablet by mouth daily., Disp: , Rfl:  .  hydrochlorothiazide (HYDRODIURIL) 25 MG tablet, TAKE 1 TABLET BY MOUTH EVERY DAY IN THE MORNING, Disp: , Rfl: 1 .  hydrocortisone 2.5 %  cream, APPLY THIN LAYER TO THE AFFECTED SKIN TWICE A DAY FOR 2 WEEKS, Disp: , Rfl:  .  ibuprofen (ADVIL,MOTRIN) 400 MG tablet, Take 400 mg by mouth every 6 (six) hours as needed., Disp: , Rfl:  .  Multiple Vitamin (MULTIVITAMIN) tablet, Take 1 tablet by mouth daily., Disp: , Rfl:  .  pantoprazole (PROTONIX) 40 MG tablet, Take 1 tablet (40 mg total) by mouth daily., Disp: 30 tablet, Rfl: 5 .  polycarbophil (FIBERCON) 625  MG tablet, Take by mouth., Disp: , Rfl:  .  polyethylene glycol (MIRALAX / GLYCOLAX) 17 g packet, Take 17 g by mouth as needed., Disp: , Rfl:  .  UNABLE TO FIND, Ultimate eye support, Disp: , Rfl:  .  valACYclovir (VALTREX) 500 MG tablet, Take one tablet po bid x3 days at onset of outbreak prn., Disp: 30 tablet, Rfl: 1  Social History   Tobacco Use  Smoking Status Former Smoker  . Quit date: 03/19/2000  . Years since quitting: 18.7  Smokeless Tobacco Never Used    Allergies  Allergen Reactions  . Amoxicillin-Pot Clavulanate     REACTION: rash  . Ceclor [Cefaclor]     REACTION: rash  . Codeine     REACTION: nausea  . Doxycycline     Slight itching  . Keflex [Cephalexin]     itching   Objective:  There were no vitals filed for this visit. There is no height or weight on file to calculate BMI. Constitutional Well developed. Well nourished.  Vascular Dorsalis pedis pulses palpable bilaterally. Posterior tibial pulses palpable bilaterally. Capillary refill normal to all digits.  No cyanosis or clubbing noted. Pedal hair growth normal.  Neurologic Normal speech. Oriented to person, place, and time. Epicritic sensation to light touch grossly present bilaterally.  Dermatologic  left hallux nail painfully ingrowing medial border No open wounds. No skin lesions.  Orthopedic: Normal joint ROM without pain or crepitus bilaterally, except for left first MPJ with crepitus and pain on attempted range of motion No visible deformities.]    Radiographs: Repeat x-rays  with degenerative changes of the first metatarsophalangeal joint  Assessment:   1. Capsulitis of metatarsophalangeal (MTP) joint of left foot   2. Hallux limitus of left foot   3. Arthritis of big toe   4. Pain around toenail   5. Ingrown nail   6. Synovitis and tenosynovitis    Plan:  Patient was evaluated and treated and all questions answered.  L Hallux Limitus -Discussed again proceeding with surgery.  Repeat x-rays with worsening arthritic changes.  Discussed the patient that we could proceed with first metatarsophalangeal joint replacement.  Patient would like to proceed.  Consent form reviewed and signed by patient.  Palliative injection delivered to the first MPJ.  Procedure: Joint Injection Location: Left 1st MPJ joint Skin Prep: Alcohol. Injectate: 0.5 cc 1% lidocaine plain, 0.5 cc dexamethasone phosphate. Disposition: Patient tolerated procedure well. Injection site dressed with a band-aid.  Left great toe ingrown toenail -Debrided in slant back fashion  25 minutes of face to face time were spent with the patient. >50% of this was spent on counseling and coordination of care. Specifically discussed with patient the above diagnoses and overall treatment plan.  This was separate from procedure time  No follow-ups on file.

## 2019-01-01 ENCOUNTER — Ambulatory Visit: Payer: Medicare Other | Admitting: Podiatry

## 2019-01-01 ENCOUNTER — Other Ambulatory Visit: Payer: Self-pay

## 2019-01-01 DIAGNOSIS — M79676 Pain in unspecified toe(s): Secondary | ICD-10-CM

## 2019-01-01 DIAGNOSIS — L600 Ingrowing nail: Secondary | ICD-10-CM | POA: Diagnosis not present

## 2019-01-01 MED ORDER — NEOMYCIN-POLYMYXIN-HC 3.5-10000-1 OT SOLN: OTIC | 0 refills | Status: DC

## 2019-01-01 NOTE — Patient Instructions (Signed)

## 2019-01-01 NOTE — Progress Notes (Signed)
Subjective:  Patient ID: Alexandria Mercado, female    DOB: [birth year],  MRN: [id number]  Chief Complaint  Patient presents with  . Nail Problem    Left 1st lateral border is very painful since last visit. Pt states no drainage.    63 y.o. female presents with the above complaint. Hx as above.   Review of Systems: Negative except as noted in the HPI. Denies N/V/F/Ch.  Past Medical History:  Diagnosis Date  . Anxiety   . Chronic RLQ pain 1989   secondary to adhesions  . Depression    grief counseling  . Diverticulosis of colon (without mention of hemorrhage)   . Esophageal obstruction   . Esophageal ring   . GERD with stricture   . Hypertension   . Ingrown nail 2018  . Pneumonia   . PONV (postoperative nausea and vomiting)   . Premature menopause age 79   On HRT age 44 - 2007  . Smoker   . Sprain of right knee 09/2016  . Trigger thumb of right hand 09/2016  . Vitamin D deficiency     Current Outpatient Medications:  .  ALPRAZolam (XANAX) 1 MG tablet, Takes 1/2 prn, Disp: , Rfl: 0 .  Calcium Carb-Cholecalciferol (CALTRATE 600+D) 600-800 MG-UNIT TABS, Take 1 tablet by mouth daily., Disp: , Rfl:  .  cholecalciferol (VITAMIN D) 1000 UNITS tablet, Take 1,000 Units by mouth daily., Disp: , Rfl:  .  Cranberry 400 MG CAPS, Take by mouth daily., Disp: , Rfl:  .  cyclobenzaprine (FLEXERIL) 10 MG tablet, TAKE 1 TABLET EVERY DAY IF NEEDED, Disp: , Rfl: 0 .  FIBER PO, Take by mouth daily., Disp: , Rfl:  .  glucosamine-chondroitin 500-400 MG tablet, Take 1 tablet by mouth daily., Disp: , Rfl:  .  hydrochlorothiazide (HYDRODIURIL) 25 MG tablet, TAKE 1 TABLET BY MOUTH EVERY DAY IN THE MORNING, Disp: , Rfl: 1 .  hydrocortisone 2.5 % cream, APPLY THIN LAYER TO THE AFFECTED SKIN TWICE A DAY FOR 2 WEEKS, Disp: , Rfl:  .  ibuprofen (ADVIL,MOTRIN) 400 MG tablet, Take 400 mg by mouth every 6 (six) hours as needed., Disp: , Rfl:  .  meloxicam (MOBIC) 15 MG tablet, TAKE 1 TABLET BY MOUTH EVERY  DAY, Disp: , Rfl:  .  Multiple Vitamin (MULTIVITAMIN) tablet, Take 1 tablet by mouth daily., Disp: , Rfl:  .  pantoprazole (PROTONIX) 40 MG tablet, Take 1 tablet (40 mg total) by mouth daily., Disp: 30 tablet, Rfl: 5 .  polycarbophil (FIBERCON) 625 MG tablet, Take by mouth., Disp: , Rfl:  .  polyethylene glycol (MIRALAX / GLYCOLAX) 17 g packet, Take 17 g by mouth as needed., Disp: , Rfl:  .  UNABLE TO FIND, Ultimate eye support, Disp: , Rfl:  .  valACYclovir (VALTREX) 500 MG tablet, Take one tablet po bid x3 days at onset of outbreak prn., Disp: 30 tablet, Rfl: 1 .  neomycin-polymyxin-hydrocortisone (CORTISPORIN) OTIC solution, Apply 2 drops to the ingrown toenail site twice daily. Cover with band-aid., Disp: 10 mL, Rfl: 0  Social History   Tobacco Use  Smoking Status Former Smoker  . Quit date: 03/19/2000  . Years since quitting: 18.8  Smokeless Tobacco Never Used    Allergies  Allergen Reactions  . Amoxicillin-Pot Clavulanate     REACTION: rash  . Ceclor [Cefaclor]     REACTION: rash  . Codeine     REACTION: nausea  . Doxycycline     Slight itching  . Keflex [Cephalexin]  itching   Objective:  There were no vitals filed for this visit. There is no height or weight on file to calculate BMI. Constitutional Well developed. Well nourished.  Vascular Dorsalis pedis pulses palpable bilaterally. Posterior tibial pulses palpable bilaterally. Capillary refill normal to all digits.  No cyanosis or clubbing noted. Pedal hair growth normal.  Neurologic Normal speech. Oriented to person, place, and time. Epicritic sensation to light touch grossly present bilaterally.  Dermatologic Painful ingrowing nail at lateral nail borders of the hallux nail left. No other open wounds. No skin lesions.  Orthopedic: Normal joint ROM without pain or crepitus bilaterally. No visible deformities. No bony tenderness.   Radiographs: None Assessment:   1. Ingrown nail   2. Pain around  toenail    Plan:  Patient was evaluated and treated and all questions answered.  Ingrown Nail, left -Patient elects to proceed with minor surgery to remove ingrown toenail removal today. Consent reviewed and signed by patient. -Ingrown nail excised. See procedure note. -Educated on post-procedure care including soaking. Written instructions provided and reviewed. -Patient to follow up in 2 weeks for nail check.  Procedure: Excision of Ingrown Toenail Location: Left 1st lateral nail borders. Anesthesia: Lidocaine 1% plain; 1.5 mL and Marcaine 0.5% plain; 1.5 mL, digital block. Skin Prep: Betadine. Dressing: Silvadene; telfa; dry, sterile, compression dressing. Technique: Following skin prep, the toe was exsanguinated and a tourniquet was secured at the base of the toe. The affected nail border was freed, split with a nail splitter, and excised. Chemical matrixectomy was then performed with phenol and irrigated out with alcohol. The tourniquet was then removed and sterile dressing applied. Disposition: Patient tolerated procedure well. Patient to return in 2 weeks for follow-up.   No follow-ups on file.

## 2019-01-02 ENCOUNTER — Telehealth: Payer: Self-pay | Admitting: *Deleted

## 2019-01-02 NOTE — Telephone Encounter (Signed)
DOS 01/28/2019  KELLER BUNION IMPLANT LEFT FOOT - [id number]  BCBS: Eligibility Date - 10/18/2018 - 03/18/9998   In-Network    Max Per Benefit Period Year-to-Date Remaining  CoInsurance  $4200.00 $4015.91  Deductible     Out-Of-Pocket  $4200.00 4015.91   [facility]  In Network  [employer] Coinsurance  $250.00 per VISITS  $4200.00   Benefit Limits: 4015.91 remaining for SERVICE YEAR

## 2019-01-05 ENCOUNTER — Telehealth: Payer: Self-pay | Admitting: *Deleted

## 2019-01-05 DIAGNOSIS — F419 Anxiety disorder, unspecified: Secondary | ICD-10-CM | POA: Diagnosis not present

## 2019-01-05 DIAGNOSIS — I10 Essential (primary) hypertension: Secondary | ICD-10-CM | POA: Diagnosis not present

## 2019-01-05 DIAGNOSIS — R7303 Prediabetes: Secondary | ICD-10-CM | POA: Diagnosis not present

## 2019-01-05 DIAGNOSIS — M545 Low back pain: Secondary | ICD-10-CM | POA: Diagnosis not present

## 2019-01-05 NOTE — Telephone Encounter (Signed)
Pt states she can not afford the drops and would like to know if she could use polysporin.

## 2019-01-05 NOTE — Telephone Encounter (Signed)
Left message informing pt she could use the polysporin.

## 2019-01-15 ENCOUNTER — Other Ambulatory Visit: Payer: Self-pay

## 2019-01-15 ENCOUNTER — Ambulatory Visit: Payer: Medicare Other | Admitting: Podiatry

## 2019-01-15 DIAGNOSIS — L600 Ingrowing nail: Secondary | ICD-10-CM

## 2019-01-15 DIAGNOSIS — M79676 Pain in unspecified toe(s): Secondary | ICD-10-CM

## 2019-01-15 NOTE — Progress Notes (Signed)
  Subjective:  Patient ID: Alexandria Mercado, female    DOB: 02-14-1956,  MRN: [id number]  Chief Complaint  Patient presents with  . Nail Problem    Pt states Left 1st medial border is still tender, has one sharp corner that is digging in, still has minor drainage, and is continuing epsom soaks.   63 y.o. female returns for the above complaint.   Objective:   General AA&O x3. Normal mood and affect.  Vascular Foot warm and well perfused with good capillary refill.  Neurologic Sensation grossly intact.  Dermatologic Nail avulsion site healing well without drainage or erythema. Nail bed with overlying soft crust.  No signs of local infection.  Orthopedic: No tenderness to palpation of the toe.   Assessment & Plan:  Patient was evaluated and treated and all questions answered.  S/p Ingrown Toenail Excision, left -Healing well without issue. -Discussed return precautions. -Nail gently debrided at medial border  Left hallux limitus -Pending joint replacement on 11/11

## 2019-01-19 ENCOUNTER — Telehealth: Payer: Self-pay | Admitting: *Deleted

## 2019-01-19 NOTE — Telephone Encounter (Signed)
"  I'm scheduled for surgery on the eleventh with Dr. March Rummage.  I need to postpone it.  I'm dealing with some other health issues right now.  I won't be able to have the surgery.  It's for 11/11 with Dr. March Rummage.  Please let me know if I need to cancel with the surgical center or if you will."  I attempted to return her call.  I left her a message that I would cancel her surgery.  I advised her to call us if she needs any conservative treatments.  I told her I would call the surgical center and cancel the surgery as well.

## 2019-01-20 ENCOUNTER — Encounter: Payer: Medicare Other | Admitting: Internal Medicine

## 2019-01-20 NOTE — Telephone Encounter (Signed)
Noted thanks °

## 2019-01-27 ENCOUNTER — Ambulatory Visit: Payer: Medicare Other

## 2019-02-26 ENCOUNTER — Encounter: Payer: Self-pay | Admitting: Internal Medicine

## 2019-03-12 DIAGNOSIS — R05 Cough: Secondary | ICD-10-CM | POA: Diagnosis not present

## 2019-03-16 ENCOUNTER — Other Ambulatory Visit: Payer: Self-pay

## 2019-03-16 ENCOUNTER — Ambulatory Visit (AMBULATORY_SURGERY_CENTER): Payer: Self-pay | Admitting: *Deleted

## 2019-03-16 VITALS — Temp 96.8°F | Ht 68.0 in | Wt 215.4 lb

## 2019-03-16 DIAGNOSIS — Z1159 Encounter for screening for other viral diseases: Secondary | ICD-10-CM

## 2019-03-16 DIAGNOSIS — Z86010 Personal history of colonic polyps: Secondary | ICD-10-CM

## 2019-03-16 NOTE — Progress Notes (Signed)
Pt is aware that care partner will wait in the car during procedure; if they feel like they will be too hot or cold to wait in the car; they may wait in the 4 th floor lobby. Patient is aware to bring only one care partner. We want them to wear a mask (we do not have any that we can provide them), practice social distancing, and we will check their temperatures when they get here.  I did remind the patient that their care partner needs to stay in the parking lot the entire time and have a cell phone available, we will call them when the pt is ready for discharge. Patient will wear mask into building.  Pt is given 2 day Miralax/Miralax prep d/t constipation  No egg or soy allergy  No home oxygen use or problems with anesthesia  No medications for weight loss taken  emmi information given

## 2019-03-16 NOTE — Addendum Note (Signed)
Addended by: Laverna Peace on: 03/16/2019 03:21 PM   Modules accepted: Orders

## 2019-03-23 ENCOUNTER — Encounter: Payer: Self-pay | Admitting: Internal Medicine

## 2019-03-26 ENCOUNTER — Ambulatory Visit (INDEPENDENT_AMBULATORY_CARE_PROVIDER_SITE_OTHER): Payer: Medicare Other

## 2019-03-26 ENCOUNTER — Other Ambulatory Visit: Payer: Self-pay | Admitting: Internal Medicine

## 2019-03-26 DIAGNOSIS — Z1159 Encounter for screening for other viral diseases: Secondary | ICD-10-CM

## 2019-03-27 LAB — SARS CORONAVIRUS 2 (TAT 6-24 HRS): SARS Coronavirus 2: NEGATIVE

## 2019-03-31 ENCOUNTER — Ambulatory Visit (AMBULATORY_SURGERY_CENTER): Payer: Medicare Other | Admitting: Internal Medicine

## 2019-03-31 ENCOUNTER — Encounter: Payer: Self-pay | Admitting: Internal Medicine

## 2019-03-31 ENCOUNTER — Other Ambulatory Visit: Payer: Self-pay

## 2019-03-31 VITALS — BP 139/78 | HR 78 | Temp 97.5°F | Resp 24 | Ht 68.0 in | Wt 215.0 lb

## 2019-03-31 DIAGNOSIS — D122 Benign neoplasm of ascending colon: Secondary | ICD-10-CM

## 2019-03-31 DIAGNOSIS — Z86010 Personal history of colonic polyps: Secondary | ICD-10-CM

## 2019-03-31 DIAGNOSIS — D120 Benign neoplasm of cecum: Secondary | ICD-10-CM

## 2019-03-31 DIAGNOSIS — D125 Benign neoplasm of sigmoid colon: Secondary | ICD-10-CM

## 2019-03-31 DIAGNOSIS — D124 Benign neoplasm of descending colon: Secondary | ICD-10-CM

## 2019-03-31 DIAGNOSIS — I10 Essential (primary) hypertension: Secondary | ICD-10-CM | POA: Diagnosis not present

## 2019-03-31 DIAGNOSIS — K219 Gastro-esophageal reflux disease without esophagitis: Secondary | ICD-10-CM | POA: Diagnosis not present

## 2019-03-31 MED ORDER — SODIUM CHLORIDE 0.9 % IV SOLN: 500.0000 mL | Freq: Once | INTRAVENOUS | Status: DC

## 2019-03-31 NOTE — Patient Instructions (Addendum)
I found and removed 5 tiny polyps. All look benign, might be pre-cancerous. I will let you know pathology results and when to have another routine colonoscopy by mail and/or My Chart.  You also have a condition called diverticulosis - common and not usually a problem. Please read the handout provided.  Also your hemorrhoids (all have these) were inflamed - likely from the prep. Not a problem - If you have hemorrhoid problems (swelling, itching, bleeding) I am able to treat those with an in-office procedure. If you like, please call my office at [phone] to schedule an appointment and I can evaluate you further.   Here is some info regarding changing eating habits for a healthier life and weight loss possibly. Great job on weaning from the acid blocker. Losing some weight will help reflux and overall health even more. Not easy to do because the foods we are presented with often go against that. If you do the free learnming modules on the [employer] site and also look at low carb eating on Diet Doctor and Dr. Gwenlyn Found sites you can find ways to change your eating, lose weight and not feel hungry.  Thanks for listening to me about this.  I appreciate the opportunity to care for you. Gatha Mayer, MD, FACG   YOU HAD AN ENDOSCOPIC PROCEDURE TODAY AT [facility]:   Refer to the procedure report that was given to you for any specific questions about what was found during the examination.  If the procedure report does not answer your questions, please call your gastroenterologist to clarify.  If you requested that your care partner not be given the details of your procedure findings, then the procedure report has been included in a sealed envelope for you to review at your convenience later.  YOU SHOULD EXPECT: Some feelings of bloating in the abdomen. Passage of more gas than usual.  Walking can help get rid of the air that was put into your GI tract during the procedure and reduce the  bloating. If you had a lower endoscopy (such as a colonoscopy or flexible sigmoidoscopy) you may notice spotting of blood in your stool or on the toilet paper. If you underwent a bowel prep for your procedure, you may not have a normal bowel movement for a few days.  Please Note:  You might notice some irritation and congestion in your nose or some drainage.  This is from the oxygen used during your procedure.  There is no need for concern and it should clear up in a day or so.  SYMPTOMS TO REPORT IMMEDIATELY:   Following lower endoscopy (colonoscopy or flexible sigmoidoscopy):  Excessive amounts of blood in the stool  Significant tenderness or worsening of abdominal pains  Swelling of the abdomen that is new, acute  Fever of 100F or higher   For urgent or emergent issues, a gastroenterologist can be reached at any hour by calling [phone].   DIET:  We do recommend a small meal at first, but then you may proceed to your regular diet.  Drink plenty of fluids but you should avoid alcoholic beverages for 24 hours.  ACTIVITY:  You should plan to take it easy for the rest of today and you should NOT DRIVE or use heavy machinery until tomorrow (because of the sedation medicines used during the test).    FOLLOW UP: Our staff will call the number listed on your records 48-72 hours following your procedure to check on you and  address any questions or concerns that you may have regarding the information given to you following your procedure. If we do not reach you, we will leave a message.  We will attempt to reach you two times.  During this call, we will ask if you have developed any symptoms of COVID 19. If you develop any symptoms (ie: fever, flu-like symptoms, shortness of breath, cough etc.) before then, please call [phone].  If you test positive for Covid 19 in the 2 weeks post procedure, please call and report this information to [place].    If any biopsies were taken you will be  contacted by phone or by letter within the next 1-3 weeks.  Please call us at [phone] if you have not heard about the biopsies in 3 weeks.    SIGNATURES/CONFIDENTIALITY: You and/or your care partner have signed paperwork which will be entered into your electronic medical record.  These signatures attest to the fact that that the information above on your After Visit Summary has been reviewed and is understood.  Full responsibility of the confidentiality of this discharge information lies with you and/or your care-partner. Healthy and nutritious eating and weight loss are made to be harder than they need to be. A simplified diet approach based around eating normally, as much as you want without restricting intake too much is better, I think. You must avoid packaged foods and try to eat real foods as much as possible. Packaged foods, sugary sodas, highly processed foods taste great but are slow poisons that lead to obesity and/or poor health.  It is very helpful to take some time each week and plan your meals. Work with your spouse, partner, family to do this as much as possible. Preparing meals ahead to take to work or school is especially helpful and will save money, too. You can do this and by working together it can take less time.  Another way to help is to order food on-line for pick-up (or delivery if you can afford) and you will avoid impulse buys of unhealthy foods.  Some resources that I like are:  www.[employer].org (Do the learning modules about healthy eating)  www.dietdoctor.com - helps with low carb diets and also can learn about and consider intermittent  fasting. If you have diabetes would not do intermittent fasting without checking with your doctor. Best to change what you eat before doing this.  Www.drberry.com

## 2019-03-31 NOTE — Progress Notes (Signed)
Report to PACU, RN, vss, BBS= Clear.  

## 2019-03-31 NOTE — Progress Notes (Signed)
Pt's states no medical or surgical changes since previsit or office visit. 

## 2019-03-31 NOTE — Progress Notes (Signed)
Called to room to assist during endoscopic procedure.  Patient ID and intended procedure confirmed with present staff. Received instructions for my participation in the procedure from the performing physician.  

## 2019-03-31 NOTE — Op Note (Signed)
[facility] Patient Name: Alexandria Mercado Procedure Date: 03/31/2019 8:09 AM MRN: [id number] Endoscopist: Gatha Mayer , MD Age: 64 Referring MD:  Date of Birth: 1955/07/11 Gender: Female Account #: [id number] Procedure:                Colonoscopy Indications:              Surveillance: Personal history of adenomatous                            polyps on last colonoscopy > 3 years ago Medicines:                Propofol per Anesthesia, Monitored Anesthesia Care Procedure:                Pre-Anesthesia Assessment:                           - Prior to the procedure, a History and Physical                            was performed, and patient medications and                            allergies were reviewed. The patient's tolerance of                            previous anesthesia was also reviewed. The risks                            and benefits of the procedure and the sedation                            options and risks were discussed with the patient.                            All questions were answered, and informed consent                            was obtained. Prior Anticoagulants: The patient has                            taken no previous anticoagulant or antiplatelet                            agents. ASA Grade Assessment: II - A patient with                            mild systemic disease. After reviewing the risks                            and benefits, the patient was deemed in                            satisfactory condition to undergo the procedure.  After obtaining informed consent, the colonoscope                            was passed under direct vision. Throughout the                            procedure, the patient's blood pressure, pulse, and                            oxygen saturations were monitored continuously. The                            Colonoscope was introduced through the anus and   advanced to the the cecum, identified by                            appendiceal orifice and ileocecal valve. The                            colonoscopy was performed without difficulty. The                            patient tolerated the procedure well. The quality                            of the bowel preparation was good. The ileocecal                            valve, appendiceal orifice, and rectum were                            photographed. The bowel preparation used was                            Miralax via split dose instruction. Scope In: 8:15:42 AM Scope Out: 8:32:26 AM Scope Withdrawal Time: 0 hours 13 minutes 35 seconds  Total Procedure Duration: 0 hours 16 minutes 44 seconds  Findings:                 The perianal and digital rectal examinations were                            normal.                           Five sessile polyps were found in the sigmoid                            colon, descending colon, ascending colon and cecum.                            The polyps were diminutive in size. These polyps                            were removed with a cold biopsy forceps.  Resection                            and retrieval were complete. Verification of                            patient identification for the specimen was done.                            Estimated blood loss was minimal.                           Multiple small-mouthed diverticula were found in                            the sigmoid colon and descending colon.                           External and internal hemorrhoids were found.                           The exam was otherwise without abnormality on                            direct and retroflexion views. Complications:            No immediate complications. Estimated Blood Loss:     Estimated blood loss was minimal. Impression:               - Five diminutive polyps in the sigmoid colon, in                            the descending colon, in the  ascending colon and in                            the cecum, removed with a cold biopsy forceps.                            Resected and retrieved.                           - Diverticulosis in the sigmoid colon and in the                            descending colon.                           - External and internal hemorrhoids.                           - The examination was otherwise normal on direct                            and retroflexion views.                           -  Personal history of colonic polyps. Adenomas x 4                            2017 Recommendation:           - Patient has a contact number available for                            emergencies. The signs and symptoms of potential                            delayed complications were discussed with the                            patient. Return to normal activities tomorrow.                            Written discharge instructions were provided to the                            patient.                           - Resume previous diet.                           - Continue present medications. Gatha Mayer, MD 03/31/2019 8:47:24 AM This report has been signed electronically.

## 2019-04-02 ENCOUNTER — Telehealth: Payer: Self-pay

## 2019-04-02 ENCOUNTER — Encounter: Payer: Self-pay | Admitting: Podiatry

## 2019-04-02 ENCOUNTER — Ambulatory Visit: Payer: Medicare Other | Admitting: Podiatry

## 2019-04-02 ENCOUNTER — Other Ambulatory Visit: Payer: Self-pay

## 2019-04-02 VITALS — BP 137/82

## 2019-04-02 DIAGNOSIS — L600 Ingrowing nail: Secondary | ICD-10-CM

## 2019-04-02 DIAGNOSIS — M79676 Pain in unspecified toe(s): Secondary | ICD-10-CM

## 2019-04-02 NOTE — Telephone Encounter (Signed)
Attempted to reach patient for post-procedure f/u call. No answer. Left message that we will make another attempt to reach her again later today and for her to please not hesitate to call us if she has any questions/concerns regarding her care. 

## 2019-04-02 NOTE — Telephone Encounter (Signed)
Second follow up call attempted, unable to reach.

## 2019-04-06 DIAGNOSIS — Z03818 Encounter for observation for suspected exposure to other biological agents ruled out: Secondary | ICD-10-CM | POA: Diagnosis not present

## 2019-04-06 DIAGNOSIS — Z13220 Encounter for screening for lipoid disorders: Secondary | ICD-10-CM | POA: Diagnosis not present

## 2019-04-06 DIAGNOSIS — Z0000 Encounter for general adult medical examination without abnormal findings: Secondary | ICD-10-CM | POA: Diagnosis not present

## 2019-04-06 DIAGNOSIS — Z131 Encounter for screening for diabetes mellitus: Secondary | ICD-10-CM | POA: Diagnosis not present

## 2019-04-06 DIAGNOSIS — I10 Essential (primary) hypertension: Secondary | ICD-10-CM | POA: Diagnosis not present

## 2019-04-06 DIAGNOSIS — E559 Vitamin D deficiency, unspecified: Secondary | ICD-10-CM | POA: Diagnosis not present

## 2019-04-10 ENCOUNTER — Encounter: Payer: Self-pay | Admitting: Internal Medicine

## 2019-04-16 ENCOUNTER — Ambulatory Visit: Payer: Medicare Other | Admitting: Obstetrics & Gynecology

## 2019-04-19 ENCOUNTER — Encounter: Payer: Self-pay | Admitting: Cardiology

## 2019-04-19 DIAGNOSIS — Z7189 Other specified counseling: Secondary | ICD-10-CM | POA: Insufficient documentation

## 2019-04-19 NOTE — Progress Notes (Signed)
Cardiology Office Note   Date:  04/21/2019   ID:  Marsi, Bobinski March 24, 1955, MRN [id number]  PCP:  Julian Hy, PA-C  Cardiologist:   Minus Breeding, MD Referring:  Julian Hy, PA-C  Chief Complaint  Patient presents with  . Abnormal ECG      History of Present Illness: Alexandria Mercado is a 64 y.o. female who is referred by Julian Hy, PA-C for evaluation of HTN and an abnormal EKG.   She has no prior cardiac history other than a questionable cardiac MRI that she thinks she had years ago but she does not understand why of remember the details.  She has had high blood pressure.  By her wrist monitor its not been well controlled although today it is well controlled.  It was elevated slightly at her primary provider's office and I reviewed these records.  They started her on beta-blocker in addition to her HCTZ.  She is had no cardiac complaints.  She is not very active.  She was doing a little exercise prior to the virus.  He does a little walking has weight.  She denies chest pressure, neck or arm discomfort.  She has no shortness of breath, PND or orthopnea.  She does have any palpitations, presyncope or syncope.  She was noted recently to have an EKG with changes as below and there is no old one for comparison.   Past Medical History:  Diagnosis Date  . Anxiety   . Chronic RLQ pain 1989   secondary to adhesions  . Depression    grief counseling  . Diverticulosis of colon (without mention of hemorrhage)   . Esophageal obstruction   . GERD with stricture   . Hx of adenomatous colonic polyps 10/07/2015  . Hyperlipidemia    NO MEDS  . Hypertension   . Osteopenia   . Pneumonia   . PONV (postoperative nausea and vomiting)   . Premature menopause age 67   On HRT age 79 - 2007  . Smoker   . Trigger thumb of right hand 09/2016  . Vitamin D deficiency     Past Surgical History:  Procedure Laterality Date  . BREAST EXCISIONAL BIOPSY Right  06/11/2006  . BUNIONECTOMY Right   . CHEILECTOMY Right 9/06   great toe   . COLONOSCOPY  1988   normal  . COLONOSCOPY  9/98   normal  . COLONOSCOPY  11/03   normal  . COLONOSCOPY W/ BIOPSIES  03/2008   mild diverticula - recheck 7 years  . ESOPHAGOGASTRODUODENOSCOPY N/A 11/08/2016   Procedure: ESOPHAGOGASTRODUODENOSCOPY (EGD);  Surgeon: Gatha Mayer, MD;  Location: [facility];  Service: Endoscopy;  Laterality: N/A;  . HYSTEROSCOPY  2003  . LAPAROSCOPIC OVARIAN CYSTECTOMY Right 1985  . MM BREAST STEREO BX*L*R/S Right 06/2006    bloody nipple discharge - path reveals intraductal papilloma Dr. Annamaria Boots  . TRIGGER FINGER RELEASE Right 09/18/2016   Procedure: RELEASE TRIGGER FINGER/A-1 PULLEY RIGHT THUMB;  Surgeon: Daryll Brod, MD;  Location: [facility];  Service: Orthopedics;  Laterality: Right;  REG/FAB  . UPPER GASTROINTESTINAL ENDOSCOPY       Current Outpatient Medications  Medication Sig Dispense Refill  . ALPRAZolam (XANAX) 0.5 MG tablet Take 0.25-0.5 mg by mouth every 8 (eight) hours as needed.    . AMBULATORY NON FORMULARY MEDICATION Medication Name: immune factors twice daily    . Ascorbic Acid (VITAMIN C PO) Take 500 mg by mouth daily.    Marland Kitchen ascorbic  acid (VITAMIN C) 500 MG tablet Take by mouth daily.    . Calcium Carb-Cholecalciferol (CALTRATE 600+D) 600-800 MG-UNIT TABS Take 1 tablet by mouth daily.    . cholecalciferol (VITAMIN D) 1000 UNITS tablet Take 2,000 Units by mouth daily.     . Cranberry 400 MG CAPS Take by mouth daily.    . cyclobenzaprine (FLEXERIL) 10 MG tablet TAKE 1 TABLET EVERY DAY IF NEEDED  0  . Famotidine (PEPCID AC PO) Take by mouth as needed.    Marland Kitchen FIBER PO Take by mouth daily.    Marland Kitchen glucosamine-chondroitin 500-400 MG tablet Take 1 tablet by mouth daily.    . hydrochlorothiazide (HYDRODIURIL) 25 MG tablet TAKE 1 TABLET BY MOUTH EVERY DAY IN THE MORNING  1  . hydrocortisone 2.5 % cream APPLY THIN LAYER TO THE AFFECTED SKIN TWICE A DAY FOR 2  WEEKS    . ibuprofen (ADVIL,MOTRIN) 400 MG tablet Take 400 mg by mouth every 6 (six) hours as needed.    . meloxicam (MOBIC) 15 MG tablet TAKE 1 TABLET BY MOUTH EVERY DAY    . metoprolol succinate (TOPROL-XL) 25 MG 24 hr tablet Take 25 mg by mouth daily.    . Multiple Vitamin (MULTIVITAMIN) tablet Take 1 tablet by mouth daily.    Marland Kitchen neomycin-polymyxin-hydrocortisone (CORTISPORIN) OTIC solution Apply 2 drops to the ingrown toenail site twice daily. Cover with band-aid. 10 mL 0  . pantoprazole (PROTONIX) 40 MG tablet Take 1 tablet (40 mg total) by mouth daily. 30 tablet 5  . polycarbophil (FIBERCON) 625 MG tablet Take by mouth.    . TURMERIC PO Take by mouth daily.    Marland Kitchen UNABLE TO FIND Ultimate eye support     No current facility-administered medications for this visit.    Allergies:   Amoxicillin-pot clavulanate, Ceclor [cefaclor], Codeine, Doxycycline, and Keflex [cephalexin]    Social History:  The patient  reports that she quit smoking about 19 years ago. She has never used smokeless tobacco. She reports that she does not drink alcohol or use drugs.   Family History:  The patient's family history includes Colon cancer in an other family member; Colon cancer (age of onset: 22) in her maternal uncle; Heart failure in her maternal grandfather and mother; Hyperlipidemia in her mother; Hypertension in her brother.    ROS:  Please see the history of present illness.   Otherwise, review of systems are positive for none.   All other systems are reviewed and negative.    PHYSICAL EXAM: VS:  BP (!) 142/90   Pulse 76   Ht 5\' 8"  (1.727 m)   Wt 210 lb (95.3 kg)   LMP 09/17/1990 (Approximate)   BMI 31.93 kg/m  , BMI Body mass index is 31.93 kg/m. GENERAL:  Well appearing HEENT:  Pupils equal round and reactive, fundi not visualized, oral mucosa unremarkable NECK:  No jugular venous distention, waveform within normal limits, carotid upstroke brisk and symmetric, no bruits, no  thyromegaly LYMPHATICS:  No cervical, inguinal adenopathy LUNGS:  Clear to auscultation bilaterally BACK:  No CVA tenderness CHEST:  Unremarkable HEART:  PMI not displaced or sustained,S1 and S2 within normal limits, no S3, no S4, no clicks, no rubs, no murmurs ABD:  Flat, positive bowel sounds normal in frequency in pitch, no bruits, no rebound, no guarding, no midline pulsatile mass, no hepatomegaly, no splenomegaly EXT:  2 plus pulses throughout, no edema, no cyanosis no clubbing SKIN:  No rashes no nodules NEURO:  Cranial nerves II  through XII grossly intact, motor grossly intact throughout [facility]:  Cognitively intact, oriented to person place and time    EKG:  EKG is ordered today. The ekg ordered today demonstrates sinus rhythm, rate 76, axis within normal limits, intervals within normal limits, lateral T wave inversions unchanged from EKG done 04/05/2018.   Recent Labs: No results found for requested labs within last 8760 hours.    Lipid Panel No results found for: CHOL, TRIG, HDL, CHOLHDL, VLDL, LDLCALC, LDLDIRECT    Wt Readings from Last 3 Encounters:  04/21/19 210 lb (95.3 kg)  03/31/19 215 lb (97.5 kg)  03/16/19 215 lb 6.4 oz (97.7 kg)      Other studies Reviewed: Additional studies/ records that were reviewed today include: Office notes and EKG. Review of the above records demonstrates:  Please see elsewhere in the note.     ASSESSMENT AND PLAN:  HTN:   Her blood pressure seems to be well controlled but she is going to keep a blood pressure diary and get an arm cuff and I be happy to review these records.  On her records if not controlled and that would be consistent with her EKG.  Of note I did see some routine labs which showed normal renal function.  I do not see a TSH recently I would defer to her primary provider along with a UA to make sure there is no secondary causes.  I specifically asked about sleep apnea have symptoms consistent.  ABNORMAL EKG: I suspect  this represents repolarization changes related to left ventricular hypertrophy on her assessment with an echo.*  COVID EDUCATION: We did talk about the vaccine.   Current medicines are reviewed at length with the patient today.  The patient does not have concerns regarding medicines.  The following changes have been made:  no change  Labs/ tests ordered today include:   Orders Placed This Encounter  Procedures  . EKG 12-Lead  . ECHOCARDIOGRAM COMPLETE     Disposition:   FU with me as needed.     Signed, Minus Breeding, MD  04/21/2019 5:10 PM    [facility]

## 2019-04-21 ENCOUNTER — Other Ambulatory Visit: Payer: Self-pay

## 2019-04-21 ENCOUNTER — Encounter: Payer: Self-pay | Admitting: Cardiology

## 2019-04-21 ENCOUNTER — Ambulatory Visit: Payer: Medicare Other | Admitting: Cardiology

## 2019-04-21 VITALS — BP 142/90 | HR 76 | Ht 68.0 in | Wt 210.0 lb

## 2019-04-21 DIAGNOSIS — Z7189 Other specified counseling: Secondary | ICD-10-CM | POA: Diagnosis not present

## 2019-04-21 DIAGNOSIS — R9431 Abnormal electrocardiogram [ECG] [EKG]: Secondary | ICD-10-CM

## 2019-04-21 DIAGNOSIS — I10 Essential (primary) hypertension: Secondary | ICD-10-CM | POA: Diagnosis not present

## 2019-04-21 NOTE — Patient Instructions (Addendum)
Medication Instructions:  No Changes *If you need a refill on your cardiac medications before your next appointment, please call your pharmacy*  Lab Work: None  Testing/Procedures: Your physician has requested that you have an echocardiogram. Echocardiography is a painless test that uses sound waves to create images of your heart. It provides your doctor with information about the size and shape of your heart and how well your heart's chambers and valves are working. This procedure takes approximately one hour. There are no restrictions for this procedure. [place]  Follow-Up: At [facility], you and your health needs are our priority.  As part of our continuing mission to provide you with exceptional heart care, we have created designated Provider Care Teams.  These Care Teams include your primary Cardiologist (physician) and Advanced Practice Providers (APPs -  Physician Assistants and Nurse Practitioners) who all work together to provide you with the care you need, when you need it.  Your next appointment:   Follow up as needed  Other Instructions Purchase an automatic Omron blood pressure cuff for your upper arm.  Keep a diary of your blood pressures. May send readings through [facility].

## 2019-05-05 ENCOUNTER — Other Ambulatory Visit: Payer: Self-pay

## 2019-05-05 ENCOUNTER — Ambulatory Visit (HOSPITAL_COMMUNITY): Payer: Medicare Other | Attending: Cardiovascular Disease

## 2019-05-05 DIAGNOSIS — R9431 Abnormal electrocardiogram [ECG] [EKG]: Secondary | ICD-10-CM | POA: Insufficient documentation

## 2019-05-14 ENCOUNTER — Ambulatory Visit: Payer: Medicare Other | Attending: Internal Medicine

## 2019-05-14 DIAGNOSIS — Z23 Encounter for immunization: Secondary | ICD-10-CM | POA: Insufficient documentation

## 2019-05-14 NOTE — Progress Notes (Signed)
   Covid-19 Vaccination Clinic  Name:  Alexandria Mercado    MRN: [id number] DOB: 27-Mar-1955  05/14/2019  Alexandria Mercado was observed post Covid-19 immunization for 15 minutes without incidence. She was provided with Vaccine Information Sheet and instruction to access the V-Safe system.   Alexandria Mercado was instructed to call 911 with any severe reactions post vaccine: Marland Kitchen Difficulty breathing  . Swelling of your face and throat  . A fast heartbeat  . A bad rash all over your body  . Dizziness and weakness    Immunizations Administered    Name Date Dose VIS Date Route   Pfizer COVID-19 Vaccine 05/14/2019  1:57 PM 0.3 mL 02/27/2019 Intramuscular   Manufacturer: [employer]   Lot: [id number]   [place]: [id number]

## 2019-05-18 ENCOUNTER — Telehealth: Payer: Self-pay | Admitting: Cardiology

## 2019-05-18 NOTE — Telephone Encounter (Signed)
Patient states that she is returning a call from [place] on Friday.

## 2019-05-18 NOTE — Telephone Encounter (Signed)
Spoke with patient. Dr. Percival Spanish reviewed the blood pressure logs sent in by patient. No changes at this time. Patient verbalized understanding.

## 2019-05-18 NOTE — Telephone Encounter (Signed)
Attempted to call patient. No answer. Unable to leave a voicemail.

## 2019-05-26 ENCOUNTER — Encounter: Payer: Self-pay | Admitting: Obstetrics & Gynecology

## 2019-05-26 ENCOUNTER — Other Ambulatory Visit: Payer: Self-pay

## 2019-05-26 ENCOUNTER — Ambulatory Visit (INDEPENDENT_AMBULATORY_CARE_PROVIDER_SITE_OTHER): Payer: Medicare Other | Admitting: Obstetrics & Gynecology

## 2019-05-26 VITALS — BP 148/92 | HR 76 | Temp 97.5°F | Resp 10 | Ht 67.75 in | Wt 224.0 lb

## 2019-05-26 DIAGNOSIS — Z01411 Encounter for gynecological examination (general) (routine) with abnormal findings: Secondary | ICD-10-CM

## 2019-05-26 DIAGNOSIS — R300 Dysuria: Secondary | ICD-10-CM

## 2019-05-26 LAB — POCT URINALYSIS DIPSTICK
Bilirubin, UA: NEGATIVE
Blood, UA: NEGATIVE
Glucose, UA: NEGATIVE
Ketones, UA: NEGATIVE
Leukocytes, UA: NEGATIVE
Nitrite, UA: NEGATIVE
Protein, UA: NEGATIVE
Urobilinogen, UA: 0.2 E.U./dL
pH, UA: 5 (ref 5.0–8.0)

## 2019-05-26 NOTE — Progress Notes (Signed)
64 y.o. G0P0 Married White or Caucasian female here for annual exam.  Has experienced some urinary hesitancy.  Dip urine was normal today.  Denies dysuria.  Denies blood in urine.    PCP:  Julian Hy, PA-C.  Just seen in February.  Had blood work done.  Reports cholesterol is borderline.  Will have follow up in 3 months.  She was started on Metroprolol.  EKG was abnormal so referred to Dr. Percival Spanish.  Had repetat EKG and echocardiogram.  These were normal .   Patient's last menstrual period was 09/17/1990 (approximate).          Sexually active: No.  The current method of family planning is post menopausal status.    Exercising: No.  The patient does not participate in regular exercise at present. Smoker:  no  Health Maintenance: Pap:   06/11/17 Neg:Neg HR HPV  05/26/14 Neg. HR HPV:neg  History of abnormal Pap:  no MMG:  10/29/18 Bilateral MMG/Right Breast US - BIRADS 2 benign/density b Colonoscopy:  03/31/19 polyps removed.  Follow up 3 years.  Dr. Carlean Purl.   BMD:   06/29/14 Osteopenia TDaP:  2011.  Pt is aware this is due.  She is not going to do this un Pneumonia vaccine(s):  Pneumovax has been done.  She is aware to wait until she has completed the covid series.   Shingrix:   2015 Hep C testing: 05/31/15 Neg Screening Labs: PCP   reports that she quit smoking about 19 years ago. She has never used smokeless tobacco. She reports that she does not drink alcohol or use drugs.  Past Medical History:  Diagnosis Date  . Anxiety   . Chronic RLQ pain 1989   secondary to adhesions  . Depression    grief counseling  . Diverticulosis of colon (without mention of hemorrhage)   . Esophageal obstruction   . GERD with stricture   . Hx of adenomatous colonic polyps 10/07/2015  . Hyperlipidemia    NO MEDS  . Hypertension   . Osteopenia   . Pneumonia   . PONV (postoperative nausea and vomiting)   . Premature menopause age 39   On HRT age 9 - 2007  . Smoker   . Trigger thumb of right  hand 09/2016  . Vitamin D deficiency     Past Surgical History:  Procedure Laterality Date  . BREAST EXCISIONAL BIOPSY Right 06/11/2006  . BUNIONECTOMY Right   . CHEILECTOMY Right 9/06   great toe   . COLONOSCOPY  1988   normal  . COLONOSCOPY  9/98   normal  . COLONOSCOPY  11/03   normal  . COLONOSCOPY W/ BIOPSIES  03/2008   mild diverticula - recheck 7 years  . ESOPHAGOGASTRODUODENOSCOPY N/A 11/08/2016   Procedure: ESOPHAGOGASTRODUODENOSCOPY (EGD);  Surgeon: Gatha Mayer, MD;  Location: [facility];  Service: Endoscopy;  Laterality: N/A;  . HYSTEROSCOPY  2003  . LAPAROSCOPIC OVARIAN CYSTECTOMY Right 1985  . MM BREAST STEREO BX*L*R/S Right 06/2006    bloody nipple discharge - path reveals intraductal papilloma Dr. Annamaria Boots  . TRIGGER FINGER RELEASE Right 09/18/2016   Procedure: RELEASE TRIGGER FINGER/A-1 PULLEY RIGHT THUMB;  Surgeon: Daryll Brod, MD;  Location: [place];  Service: Orthopedics;  Laterality: Right;  REG/FAB  . UPPER GASTROINTESTINAL ENDOSCOPY      Current Outpatient Medications  Medication Sig Dispense Refill  . ALPRAZolam (XANAX) 0.5 MG tablet Take 0.25-0.5 mg by mouth every 8 (eight) hours as needed.    Marland Kitchen  AMBULATORY NON FORMULARY MEDICATION Medication Name: immune factors twice daily    . ascorbic acid (VITAMIN C) 500 MG tablet Take by mouth daily.    . Calcium Carb-Cholecalciferol (CALTRATE 600+D) 600-800 MG-UNIT TABS Take 1 tablet by mouth daily.    . cholecalciferol (VITAMIN D) 1000 UNITS tablet Take 2,000 Units by mouth daily.     . Cranberry 400 MG CAPS Take by mouth daily.    . cyclobenzaprine (FLEXERIL) 10 MG tablet TAKE 1 TABLET EVERY DAY IF NEEDED  0  . Famotidine (PEPCID AC PO) Take by mouth as needed.    Marland Kitchen FIBER PO Take by mouth daily.    Marland Kitchen glucosamine-chondroitin 500-400 MG tablet Take 1 tablet by mouth daily.    . hydrochlorothiazide (HYDRODIURIL) 25 MG tablet TAKE 1 TABLET BY MOUTH EVERY DAY IN THE MORNING  1  . ibuprofen  (ADVIL,MOTRIN) 400 MG tablet Take 400 mg by mouth every 6 (six) hours as needed.    . metoprolol succinate (TOPROL-XL) 25 MG 24 hr tablet Take 25 mg by mouth daily.    . Multiple Vitamin (MULTIVITAMIN) tablet Take 1 tablet by mouth daily.    . TURMERIC PO Take by mouth daily.    Marland Kitchen UNABLE TO FIND Ultimate eye support    . meloxicam (MOBIC) 15 MG tablet TAKE 1 TABLET BY MOUTH EVERY DAY     No current facility-administered medications for this visit.    Family History  Problem Relation Age of Onset  . Colon cancer Maternal Uncle 28  . Hyperlipidemia Mother   . Heart failure Mother   . Hypertension Brother   . Heart failure Maternal Grandfather   . Colon cancer Other   . Esophageal cancer Neg Hx   . Rectal cancer Neg Hx   . Stomach cancer Neg Hx     Review of Systems  Genitourinary: Positive for dysuria.  All other systems reviewed and are negative.   Exam:   BP (!) 148/92 (BP Location: Right Arm, Patient Position: Sitting, Cuff Size: Normal)   Pulse 76   Temp (!) 97.5 F (36.4 C) (Temporal)   Resp 10   Ht 5' 7.75" (1.721 m)   Wt 224 lb (101.6 kg)   LMP 09/17/1990 (Approximate)   BMI 34.31 kg/m   Height: 5' 7.75" (172.1 cm)  Ht Readings from Last 3 Encounters:  05/26/19 5' 7.75" (1.721 m)  04/21/19 5\' 8"  (1.727 m)  03/31/19 5\' 8"  (1.727 m)   General appearance: alert, cooperative and appears stated age Head: Normocephalic, without obvious abnormality, atraumatic Neck: no adenopathy, supple, symmetrical, trachea midline and thyroid normal to inspection and palpation Lungs: clear to auscultation bilaterally Breasts: normal appearance, no masses or tenderness Heart: regular rate and rhythm Abdomen: soft, non-tender; bowel sounds normal; no masses,  no organomegaly, umbilical hernia noted (stable) Extremities: extremities normal, atraumatic, no cyanosis or edema Skin: Skin color, texture, turgor normal. No rashes or lesions Lymph nodes: Cervical, supraclavicular, and  axillary nodes normal. No abnormal inguinal nodes palpated Neurologic: Grossly normal  Pelvic: External genitalia:  no lesions              Urethra:  normal appearing urethra with no masses, tenderness or lesions              Bartholins and Skenes: normal                 Vagina: normal appearing vagina with normal color and discharge, no lesions  Cervix: no lesions              Pap taken: No. Bimanual Exam:  Uterus:  normal size, contour, position, consistency, mobility, non-tender              Adnexa: normal adnexa and no mass, fullness, tenderness               Rectovaginal: Confirms               Anus:  normal sphincter tone, no lesions  Chaperone, Terence Lux, CMA, was present for exam.  A:  Well Woman with normal exam PMP, no HRT Hypertension Mildly elevated cholesterol H/o Vit D deficiency GERD Colon polyps Umbilical hernia  P:   Mammogram guidelines reviewed.  Pt desires to do these every other year pap smear neg with neg HR HPV 3/19.  Not indicated today.   Colonoscopy done with Dr. Carlean Purl BMD obtained today Lab work done with Julian Hy Vaccines reviewed.  Tdap due but she's going to wait until Covid vaccination is complete.  Is planning on having Shingrix this year. Return annually or prn

## 2019-06-04 ENCOUNTER — Telehealth: Payer: Self-pay | Admitting: Obstetrics & Gynecology

## 2019-06-04 NOTE — Telephone Encounter (Signed)
Spoke with patient. Patient reports a sore spot towards the top of labia/vulva, feels like a blister or cut. Symptoms started 2 days ago. Reports discomfort when walking, pinching sensation. Has been applying OTC vagisil, no relief. Denies any other symptoms. Patient asking what she should apply? Recommended OV for further evaluation, patient agreeable. OV scheduled for 3/19 at 10:30am with Melvia Heaps, CNM. [id number] prescreen negative, precautions reviewed. Patient verbalizes understanding and is agreeable.   Routing to provider for final review. Patient is agreeable to disposition. Will close encounter.  Cc: Dr. Sabra Heck

## 2019-06-04 NOTE — Telephone Encounter (Signed)
Patient wants to speak with the nurse. Patient states she is having burning around the vaginal opening. It feels raw and she wants to know what can she use over the counter.

## 2019-06-05 ENCOUNTER — Encounter: Payer: Self-pay | Admitting: Certified Nurse Midwife

## 2019-06-05 ENCOUNTER — Other Ambulatory Visit: Payer: Self-pay

## 2019-06-05 ENCOUNTER — Ambulatory Visit (INDEPENDENT_AMBULATORY_CARE_PROVIDER_SITE_OTHER): Payer: Medicare Other | Admitting: Certified Nurse Midwife

## 2019-06-05 VITALS — BP 118/70 | HR 68 | Temp 97.3°F | Resp 16 | Wt 222.0 lb

## 2019-06-05 DIAGNOSIS — N898 Other specified noninflammatory disorders of vagina: Secondary | ICD-10-CM | POA: Diagnosis not present

## 2019-06-05 DIAGNOSIS — B009 Herpesviral infection, unspecified: Secondary | ICD-10-CM

## 2019-06-05 NOTE — Patient Instructions (Signed)

## 2019-06-05 NOTE — Progress Notes (Signed)
64 y.o. Married Caucasian female G0P0 here with complaint of vaginal symptoms of blister like feeling at top of vulva. Area burns when anything touches it. She has looked with mirror and does not think it is HSV, but came to check.  Has history of HSV 2 and has started Valtrex and taken two doses. No vaginal bleeding or change in vaginal discharge that she is aware.  Onset of symptoms 2-3 days ago. Denies new personal products or vaginal dryness. No  STD concerns. Urinary symptoms none . Menopausal with vaginal dryness.  Review of Systems  Constitutional: Positive for malaise/fatigue.  HENT: Negative.   Eyes: Negative.   Respiratory: Negative.   Cardiovascular: Negative.   Gastrointestinal: Negative.   Genitourinary: Negative.   Musculoskeletal: Negative.   Skin: Negative.   Neurological: Negative.   Endo/Heme/Allergies: Negative.   Psychiatric/Behavioral: Negative.     O:Healthy female WDWN Affect: normal, orientation x 3  Exam: Skin: warm and dry Abdomen:soft, non tender, no masses Inguinal Lymph nodes: no enlargement, slight tenderness bilateral Pelvic exam: External genital: normal female, with redness noted at clitoral hood area, Herpes type blisters (2) noted in this area and shown to patient in mirror. BUS: negative Vagina: slight increase discharge noted.  Affirm taken Cervix: normal, non tender, no CMT Uterus: normal, non tender Adnexa:normal, non tender, no masses or fullness noted Rectal: normal appearance no lesions   A:Herpes 2 infection noted, history of HSV 2 Vaginal discharge   P:Discussed findings of blisters(2) noted in clitoral hood area and HSV 2 etiology. Discussed Aveeno  sitz bath for comfort. Avoid moist clothes or pads for extended period of time. Discussed continuing Valtrex (she has Rx) for 3-5 days depending on duration of blisters. Patient aware. Also recommended 1 % hydrocortisone cream bid to blister area to help with discomfort. Questions  addressed. Lab: affirm will treat if indicated  Rv prn

## 2019-06-06 LAB — VAGINITIS/VAGINOSIS, DNA PROBE
Candida Species: NEGATIVE
Gardnerella vaginalis: NEGATIVE
Trichomonas vaginosis: NEGATIVE

## 2019-06-09 ENCOUNTER — Encounter: Payer: Self-pay | Admitting: Certified Nurse Midwife

## 2019-06-09 ENCOUNTER — Ambulatory Visit: Payer: Medicare Other | Attending: Internal Medicine

## 2019-06-09 DIAGNOSIS — Z23 Encounter for immunization: Secondary | ICD-10-CM

## 2019-06-09 NOTE — Progress Notes (Signed)
   Covid-19 Vaccination Clinic  Name:  Alexandria Mercado    MRN: [id number] DOB: 06/17/1955  06/09/2019  [name] was observed post Covid-19 immunization for 15 minutes without incident. She was provided with Vaccine Information Sheet and instruction to access the V-Safe system.   [name] was instructed to call 911 with any severe reactions post vaccine: Marland Kitchen Difficulty breathing  . Swelling of face and throat  . A fast heartbeat  . A bad rash all over body  . Dizziness and weakness   Immunizations Administered    Name Date Dose VIS Date Route   Pfizer COVID-19 Vaccine 06/09/2019 12:46 PM 0.3 mL 02/27/2019 Intramuscular   Manufacturer: [place]   Lot: [id number]   [place]: [id number]

## 2019-06-17 NOTE — Progress Notes (Signed)
  Subjective:  Patient ID: Alexandria Mercado, female    DOB: June 30, 1955,  MRN: [id number]  Chief Complaint  Patient presents with  . Nail Problem    pt has a possible ingrown toenail of the left great toenail medial side, that has been going on for about 3-4 weeks, pt states that pain is elevated to the touch, pt also states that the she has had some swelling, but no signs of pus drainage    64 y.o. female presents with the above complaint. History confirmed with patient.   Objective:  Physical Exam: warm, good capillary refill, nail exam ingrown nail at left medial border, no trophic changes or ulcerative lesions. DP pulses palpable, PT pulses palpable and protective sensation intact Left Foot: normal exam, no swelling, tenderness, instability; ligaments intact, full range of motion of all ankle/foot joints  Right Foot: normal exam, no swelling, tenderness, instability; ligaments intact, full range of motion of all ankle/foot joints   No images are attached to the encounter.  Assessment:   1. Ingrown nail   2. Pain around toenail      Plan:  Patient was evaluated and treated and all questions answered.  Ingrown Nail -Nail gently debrided. Non-procedural debridement.  Return if symptoms worsen or fail to improve.

## 2019-07-24 IMAGING — MG DIGITAL SCREENING BILATERAL MAMMOGRAM WITH TOMO AND CAD
8 series · 8 of 24 positions shown · non-contrast
Comparison: Previous exam(s).

CLINICAL DATA: Screening.

EXAM:
DIGITAL SCREENING BILATERAL MAMMOGRAM WITH TOMO AND CAD

[L CC synth-2D]
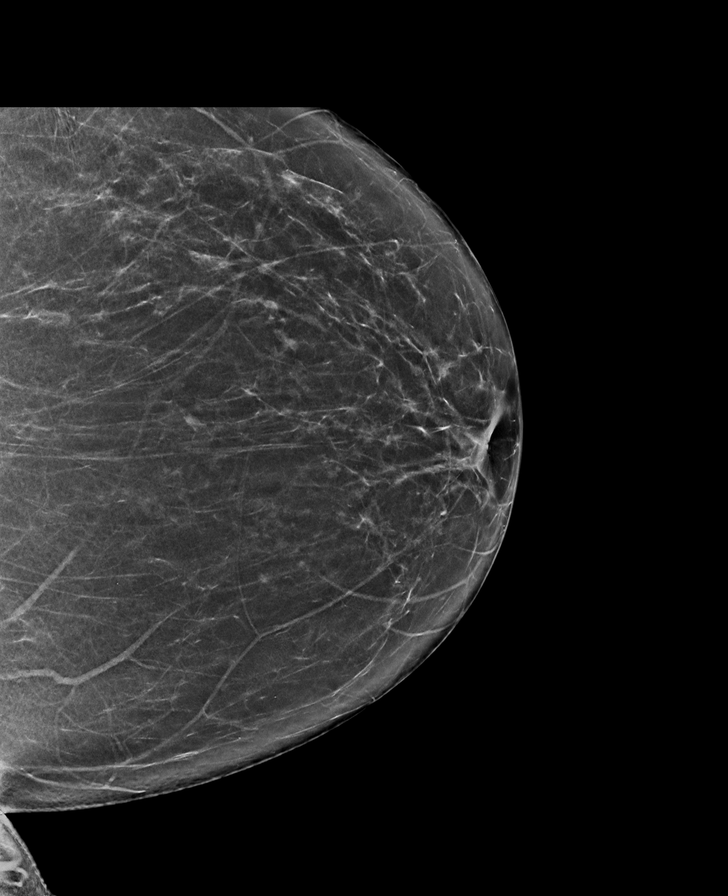

[R CC synth-2D]
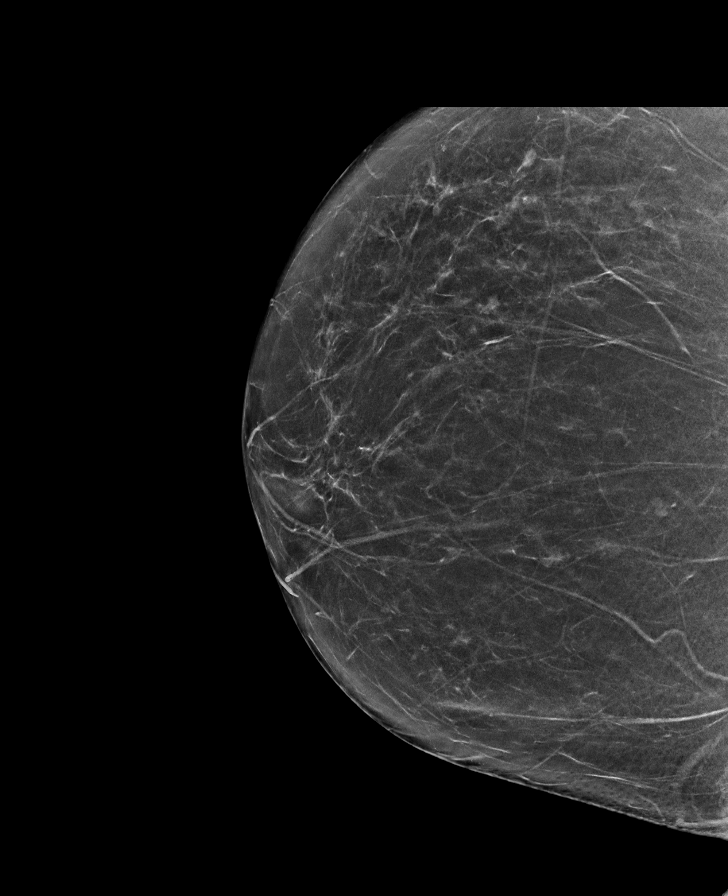

[L MLO synth-2D]
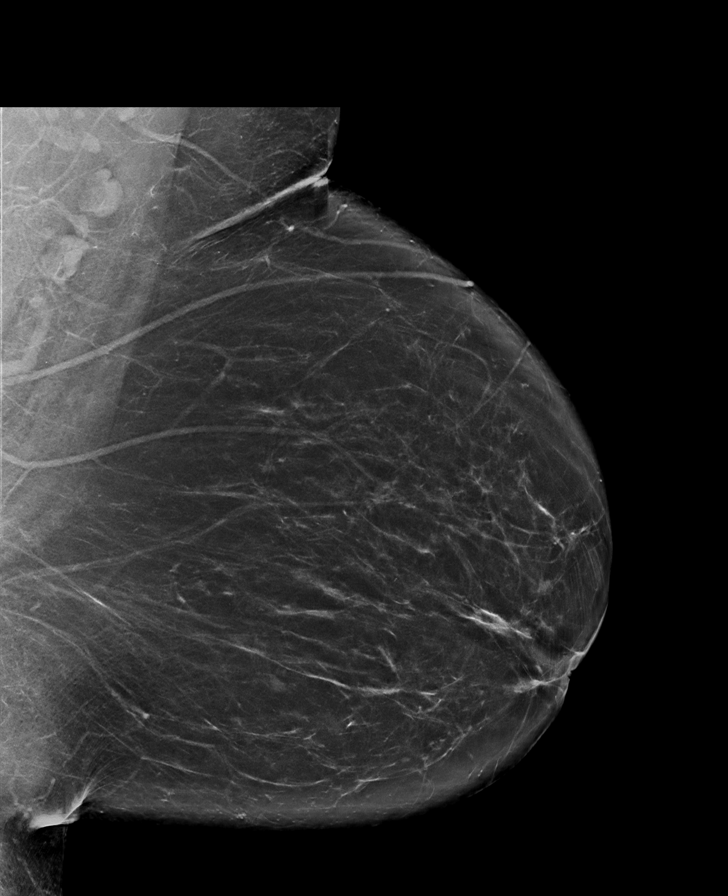

[R MLO synth-2D]
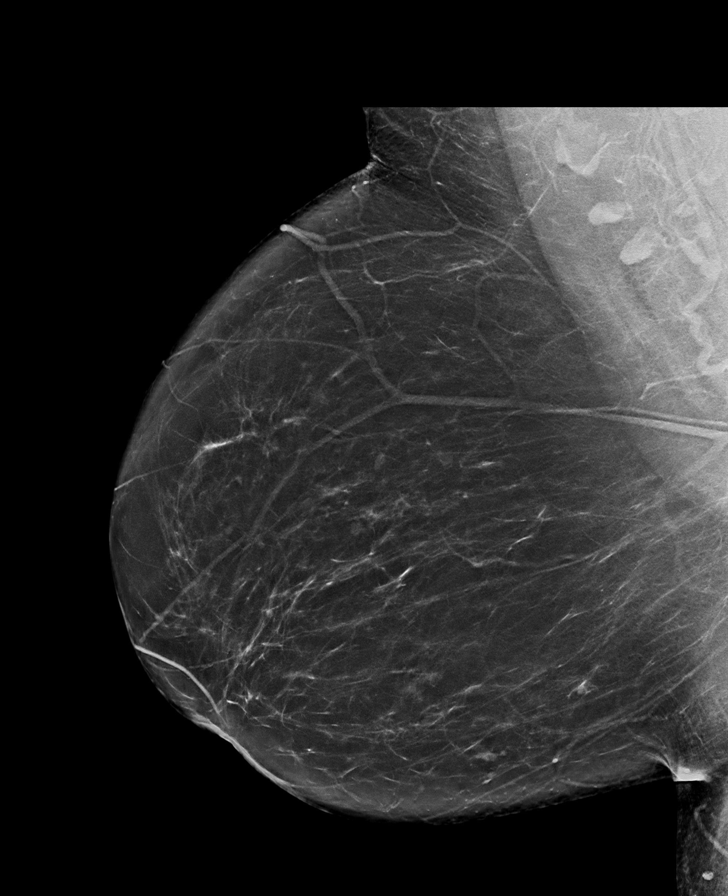

[L CC tomo · tomo slice 38/75.0]
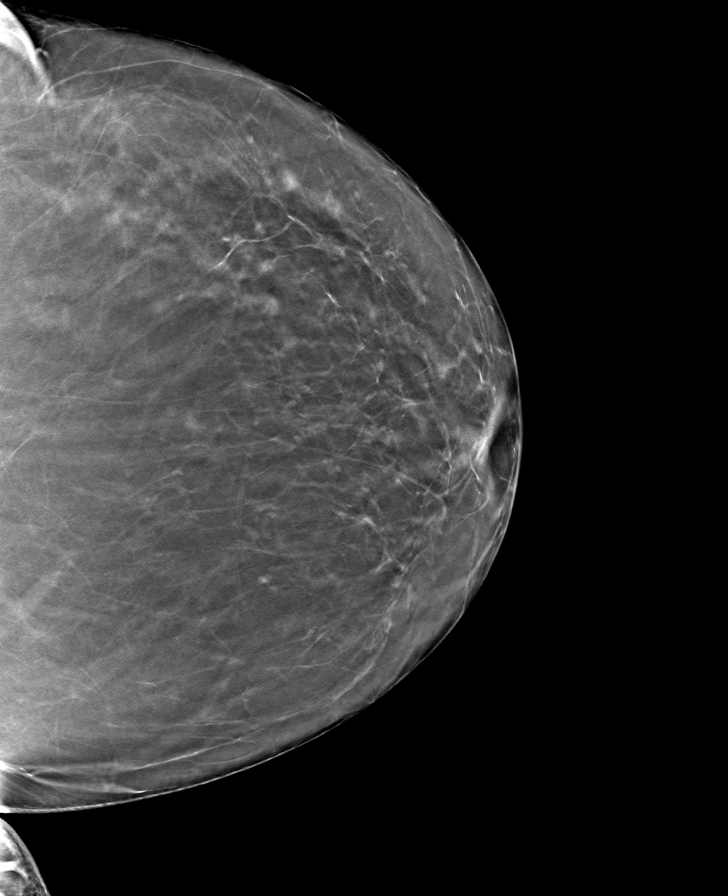

[R CC tomo · tomo slice 36/71.0]
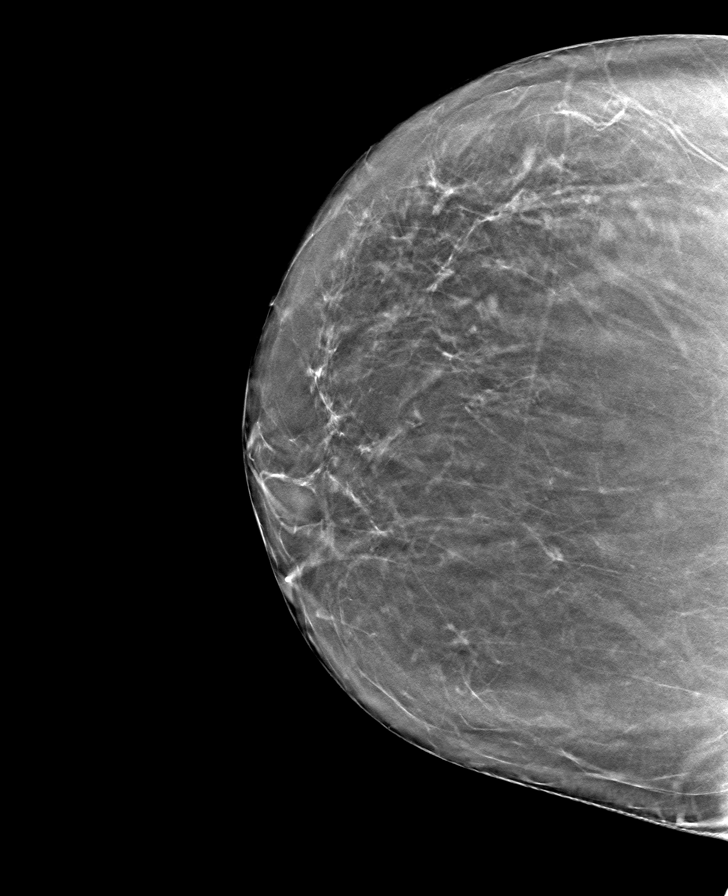

[L MLO tomo · tomo slice 47/92.0]
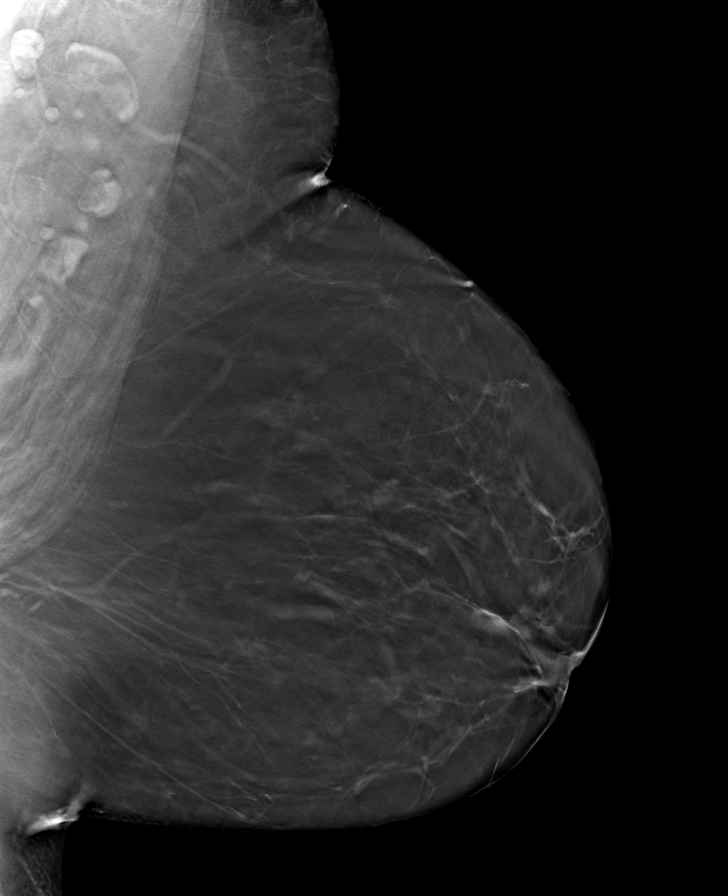

[R MLO tomo · tomo slice 45/89.0]
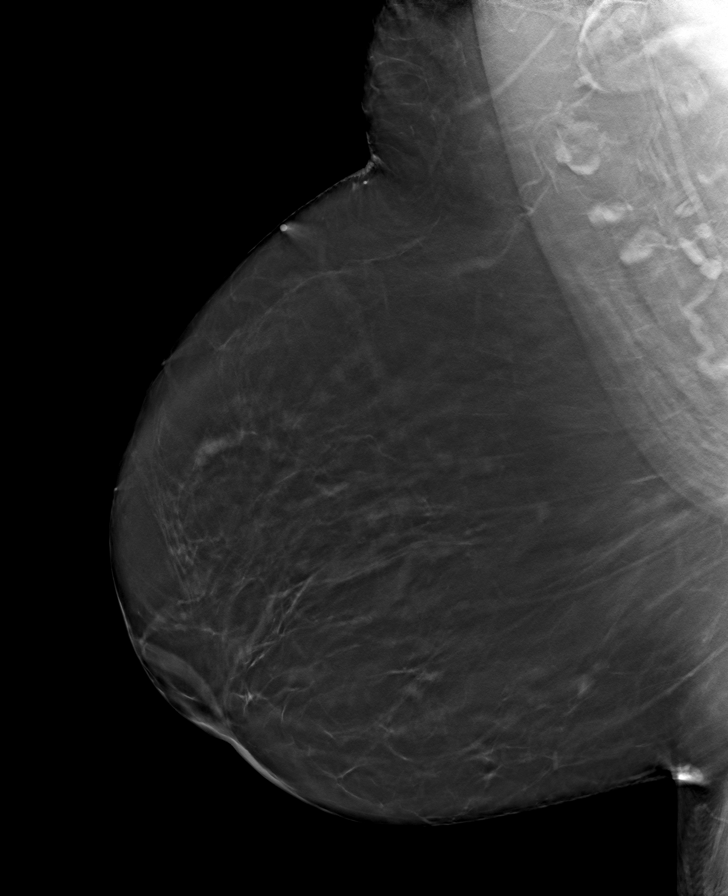

[8 of 24 positions shown; findings below may reference images not displayed]

ACR Breast Density Category b: There are scattered areas of
fibroglandular density.
FINDINGS: There are no findings suspicious for malignancy. Images were
processed with CAD.
IMPRESSION: No mammographic evidence of malignancy. A result letter of this
screening mammogram will be mailed directly to the patient.

RECOMMENDATION:
Screening mammogram in one year. (Code:[id number])

BI-RADS CATEGORY  1: Negative.

## 2019-08-27 ENCOUNTER — Other Ambulatory Visit: Payer: Self-pay

## 2019-08-27 NOTE — Telephone Encounter (Signed)
Medication refill request: Valtrex Last AEX:  05/26/19 SM Next AEX: 11/18/20 Last MMG (if hormonal medication request): n/a Refill authorized: Please advise on refill; order pended #30 w/0 refills if authorized

## 2019-08-28 ENCOUNTER — Other Ambulatory Visit: Payer: Self-pay | Admitting: Internal Medicine

## 2019-08-28 MED ORDER — VALACYCLOVIR HCL 1 G PO TABS: ORAL_TABLET | ORAL | 0 refills | Status: DC

## 2019-08-31 ENCOUNTER — Other Ambulatory Visit: Payer: Self-pay | Admitting: Internal Medicine

## 2019-09-24 ENCOUNTER — Other Ambulatory Visit: Payer: Self-pay | Admitting: Obstetrics & Gynecology

## 2019-09-24 DIAGNOSIS — Z1231 Encounter for screening mammogram for malignant neoplasm of breast: Secondary | ICD-10-CM

## 2019-09-26 ENCOUNTER — Other Ambulatory Visit: Payer: Self-pay | Admitting: Obstetrics & Gynecology

## 2019-09-28 NOTE — Telephone Encounter (Signed)
Medication refill request: Valtrex Last AEX:  05/26/19 SM Next AEX: 11/18/20 Last MMG (if hormonal medication request): n/a Refill authorized: Today, please advise

## 2019-10-06 ENCOUNTER — Other Ambulatory Visit: Payer: Self-pay | Admitting: Internal Medicine

## 2019-10-22 ENCOUNTER — Other Ambulatory Visit: Payer: Self-pay | Admitting: Internal Medicine

## 2019-10-26 ENCOUNTER — Other Ambulatory Visit: Payer: Self-pay | Admitting: Internal Medicine

## 2019-11-06 ENCOUNTER — Other Ambulatory Visit: Payer: Self-pay | Admitting: Internal Medicine

## 2019-11-11 ENCOUNTER — Telehealth: Payer: Self-pay | Admitting: Internal Medicine

## 2019-11-11 NOTE — Telephone Encounter (Signed)
Unable to reach patient , voice mail box not set up. Will try again tomorrow.

## 2019-11-11 NOTE — Telephone Encounter (Signed)
Medicine list says patient is on generic pepcid. I tried to reach her to see why she wanted the pantoprazole but her voice mail is not set up yet. I will try again later.

## 2019-11-11 NOTE — Telephone Encounter (Signed)
Patient needs to refill Pantoprazole 21m,g once a day. Please send it to [facility] on [address]. Pt states that Dr. Carlean Purl has prescribed this med before.

## 2019-11-12 NOTE — Telephone Encounter (Signed)
Unable to leave a message, voicemail full.  

## 2019-11-13 MED ORDER — PANTOPRAZOLE SODIUM 40 MG PO TBEC
40.0000 mg | DELAYED_RELEASE_TABLET | Freq: Every day | ORAL | 3 refills | Status: DC
Start: 2019-11-13 — End: 2020-10-12

## 2019-11-13 NOTE — Telephone Encounter (Signed)
I spoke with Alexandria Mercado and she is now taking some pantoprazole 40mg  that she has but it's almost out of date she said. She is hoping it will help her better than the OTC famotidine.  I told her Dr Carlean Purl is out of town and I will message him to get approval to send in a new rx for the pantoprazole.

## 2019-11-13 NOTE — Telephone Encounter (Signed)
Pt returned your call, she stated that Dr. Carlean Purl has prescribed Pantoprazole before. She even read the box for me over the phone for "Pantoprazole Sodium 40 mg." She is not sure of why med is not on her med list. She also said that if Dr. Carlean Purl wants pt to switch to a different one, she is fine with that, too.

## 2019-11-13 NOTE — Telephone Encounter (Signed)
She wanted to come off pantoorazole in 2020 per [place] visit.  Ok to Rx 40 mg pantoorazole daily again.  If that doesn't work she should make a follow up.  # [phone] 3 refills

## 2019-11-13 NOTE — Telephone Encounter (Signed)
I have sent in the pantoprazole as requested and approved. I left her a message to call me back.

## 2019-11-13 NOTE — Telephone Encounter (Signed)
Left message on her home voice mail to call me back.

## 2019-11-16 NOTE — Telephone Encounter (Signed)
Patient called in and said thanks for sending the rx and she is aware of calling us back if it doesn't work.

## 2019-11-16 NOTE — Telephone Encounter (Signed)
Left a message for her to call me back.

## 2020-01-19 ENCOUNTER — Other Ambulatory Visit: Payer: Self-pay | Admitting: Obstetrics & Gynecology

## 2020-01-19 DIAGNOSIS — Z1231 Encounter for screening mammogram for malignant neoplasm of breast: Secondary | ICD-10-CM

## 2020-01-29 ENCOUNTER — Ambulatory Visit
Admission: RE | Admit: 2020-01-29 | Discharge: 2020-01-29 | Disposition: A | Discharge status: Home / Self Care | Payer: Medicare Other | Source: Ambulatory Visit

## 2020-01-29 ENCOUNTER — Other Ambulatory Visit: Payer: Self-pay

## 2020-01-29 DIAGNOSIS — Z1231 Encounter for screening mammogram for malignant neoplasm of breast: Secondary | ICD-10-CM

## 2020-03-09 ENCOUNTER — Ambulatory Visit: Payer: Medicare Other | Admitting: Internal Medicine

## 2020-04-19 ENCOUNTER — Ambulatory Visit: Payer: Medicare Other | Admitting: Internal Medicine

## 2020-06-01 ENCOUNTER — Ambulatory Visit: Payer: Medicare Other | Admitting: Internal Medicine

## 2020-06-01 ENCOUNTER — Encounter: Payer: Self-pay | Admitting: Internal Medicine

## 2020-06-01 VITALS — BP 130/92 | HR 75 | Ht 68.0 in | Wt 231.0 lb

## 2020-06-01 DIAGNOSIS — K429 Umbilical hernia without obstruction or gangrene: Secondary | ICD-10-CM | POA: Diagnosis not present

## 2020-06-01 DIAGNOSIS — R110 Nausea: Secondary | ICD-10-CM | POA: Diagnosis not present

## 2020-06-01 DIAGNOSIS — K5909 Other constipation: Secondary | ICD-10-CM

## 2020-06-01 NOTE — Patient Instructions (Signed)
We are referring you for pelvic floor P.T. They will contact you about setting up an appointment.  Follow a high fiber diet, handout provided.  Take 1-3 tablespoons of benefiber daily, handout provided.  You may use one Senokot every 2-3 days as needed.  We are providing you with information on Umbilical Hernia's.  Try to walk more and increase your non-calorie fluid intake.  I appreciate the opportunity to care for you. Silvano Rusk, MD, [facility]

## 2020-06-01 NOTE — Progress Notes (Signed)
Alexandria Mercado 65 y.o. 1955-04-12 [id number]  Assessment & Plan:   Encounter Diagnoses  Name Primary?  . Chronic constipation Yes  . Umbilical hernia without obstruction and without gangrene   . Nausea without vomiting    Treatment plan as follows:  Benefiber 1 to 2 tablespoons daily titrate up to 3 tablespoons as needed  High-fiber diet  Senokot 1 every 2 to 3 days as needed  Refer to pelvic floor physical therapy  Exercise walk daily  Increase noncaloric fluids i.e. water especially  follow-up after PT complete to be arranged, to counsel regarding healthy eating and weight loss.  She and I suspect most of the nausea is stress related could be related to constipation at times as well  Reassured regarding chronic umbilical hernia educated what to do if it shows signs of incarceration i.e. go to the emergency department.  Umbilical hernia handout provided.  Orders Placed This Encounter  Procedures  . Ambulatory referral to Physical Therapy    I appreciate the opportunity to care for this patient. CC: Julian Hy, PA-C   Subjective:   Chief Complaint:Gas constipation hemorrhoids nausea umbilical hernia  HPI 65 year old white woman here complaining of lower abdominal pain constipation and some swollen hemorrhoids at times.  She mainly says she feels gassy and belches a lot when she has not moved her bowels well.  There is intermittent nausea often associated with stressful situations but sometimes with constipation.  She reports a cycle where she does not move her bowels well or at all for a few days or more and then has multiple bowel movements.  Especially if she takes something.  She has dabbled with MiraLAX and has used Senokot.  Senokot is effective.  Recently started stool softener but that has not made a difference.  She has GERD with a history of stricture and tried to come off PPI last year but went back on it, it controls her symptoms if she stays on  it.  She has an umbilical hernia that is always soft and she wants it to be checked out.  She does have some stress urinary incontinence at times but that is rare.  She has not delivered children.   Last colonoscopy 06/06/2019 with 5 diminutive adenomas, she has a history of prior polyps as well.  She also has left-sided diverticulosis and mixed hemorrhoids. Allergies  Allergen Reactions  . Amoxicillin-Pot Clavulanate     REACTION: rash  . Ceclor [Cefaclor]     REACTION: rash  . Codeine     REACTION: nausea  . Doxycycline     Slight itching  . Keflex [Cephalexin]     itching   Current Meds  Medication Sig  . ALPRAZolam (XANAX) 0.5 MG tablet Take 0.25-0.5 mg by mouth every 8 (eight) hours as needed.  . AMBULATORY NON FORMULARY MEDICATION Medication Name: immune factors twice daily  . ascorbic acid (VITAMIN C) 500 MG tablet Take by mouth daily.  . ASPIRIN 81 PO Take by mouth.  . Calcium-Magnesium-Vitamin D (CALCIUM MAGNESIUM PO) Take by mouth.  . Cranberry 400 MG CAPS Take by mouth daily.  . cyclobenzaprine (FLEXERIL) 10 MG tablet TAKE 1 TABLET EVERY DAY IF NEEDED  . Famotidine (PEPCID AC PO) Take by mouth every other day.   Marland Kitchen FIBER PO Take by mouth daily.  Marland Kitchen glucosamine-chondroitin 500-400 MG tablet Take 1 tablet by mouth daily.  . hydrochlorothiazide (HYDRODIURIL) 25 MG tablet TAKE 1 TABLET BY MOUTH EVERY DAY IN THE MORNING  .  ibuprofen (ADVIL,MOTRIN) 400 MG tablet Take 400 mg by mouth every 6 (six) hours as needed.  . metoprolol succinate (TOPROL-XL) 25 MG 24 hr tablet Take 50 mg by mouth daily.  . Multiple Vitamin (MULTIVITAMIN) tablet Take 1 tablet by mouth daily.  . pantoprazole (PROTONIX) 40 MG tablet Take 1 tablet (40 mg total) by mouth daily before breakfast.  . TURMERIC PO Take by mouth.   Marland Kitchen UNABLE TO FIND Ultimate eye support  . valACYclovir (VALTREX) 500 MG tablet Take 1 tablet (500 mg total) by mouth daily. Take 1 tab daily for suppressive therapy and increase to bid x  3 days with any new symptoms  . VITAMIN D PO Take 3,000 Int'l Units by mouth daily.   Past Medical History:  Diagnosis Date  . Anxiety   . Chronic RLQ pain 1989   secondary to adhesions  . Depression    grief counseling  . Diverticulosis of colon (without mention of hemorrhage)   . Esophageal obstruction   . GERD with stricture   . Hx of adenomatous colonic polyps 10/07/2015  . Hyperlipidemia    NO MEDS  . Hypertension   . Osteopenia   . Pneumonia   . PONV (postoperative nausea and vomiting)   . Premature menopause age 64   On HRT age 57 - 2007  . Smoker   . Trigger thumb of right hand 09/2016  . Vitamin D deficiency    Past Surgical History:  Procedure Laterality Date  . BREAST EXCISIONAL BIOPSY Right 06/11/2006  . BUNIONECTOMY Right   . CHEILECTOMY Right 9/06   great toe   . COLONOSCOPY  1988   normal  . COLONOSCOPY  9/98   normal  . COLONOSCOPY  11/03   normal  . COLONOSCOPY W/ BIOPSIES  03/2008   mild diverticula - recheck 7 years  . ESOPHAGOGASTRODUODENOSCOPY N/A 11/08/2016   Procedure: ESOPHAGOGASTRODUODENOSCOPY (EGD);  Surgeon: Gatha Mayer, MD;  Location: [facility];  Service: Endoscopy;  Laterality: N/A;  . HYSTEROSCOPY  2003  . LAPAROSCOPIC OVARIAN CYSTECTOMY Right 1985  . MM BREAST STEREO BX*L*R/S Right 06/2006    bloody nipple discharge - path reveals intraductal papilloma Dr. Annamaria Boots  . TRIGGER FINGER RELEASE Right 09/18/2016   Procedure: RELEASE TRIGGER FINGER/A-1 PULLEY RIGHT THUMB;  Surgeon: Daryll Brod, MD;  Location: [facility];  Service: Orthopedics;  Laterality: Right;  REG/FAB  . UPPER GASTROINTESTINAL ENDOSCOPY     Social History   Social History Narrative   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     family history includes Colon cancer in an other family member; Colon cancer (age of onset: 39) in her maternal uncle; Heart failure in her maternal grandfather and mother; Hyperlipidemia in her mother; Hypertension in her  brother.   Review of Systems As per HPI  Objective:   Physical Exam BP (!) 130/92   Pulse 75   Ht 5\' 8"  (1.727 m)   Wt 231 lb (104.8 kg)   LMP 09/17/1990 (Approximate)   BMI 35.12 kg/m   Obese pleasant white woman in no acute distress.  June McMurry, CMA present for exam.  Abdomen is obese soft, there is a soft ping-pong ball sized umbilical hernia present easily reducible.  I suspect it contains fat.  It is not tender.   Rectal exam shows a normal anoderm with an intact anal wink.  There is good resting and voluntary tone.  There is formed stool in the vault no rectocele or mass.  Simulated defecation reveals appropriate abdominal contraction but what I perceived is an increased amount of descent with relaxation that is appropriate.

## 2020-06-16 ENCOUNTER — Encounter: Payer: Self-pay | Admitting: Obstetrics & Gynecology

## 2020-08-19 ENCOUNTER — Telehealth: Payer: Self-pay | Admitting: Cardiology

## 2020-08-19 DIAGNOSIS — R002 Palpitations: Secondary | ICD-10-CM

## 2020-08-19 NOTE — Telephone Encounter (Signed)
Returned the call to the patient. She stated that lately she has noticed increased skipped beats. She checks her pulse manually and can feel the skipped beats. She has also been having increased episodes of dizziness when going from sitting to standing.   Blood pressures have been running in the 130/80's and heart rates in the 60's.  She currently takes Metoprolol Succinate 25 mg once daily and HCTZ 25 mg once daily.   She did say that she has not been staying hydrated like she should and she does drink too much caffeine. She has been advised to rise slowly when standing and wait until the dizziness subsides before starting to walk. She has been advised to decrease her caffeine intake and to stay hydrated as this could be causing the increase in palpitations.   She would like to know if she could wear a cardiac monitor. There were no appointments available this month either with Dr. Percival Spanish or an app.

## 2020-08-19 NOTE — Telephone Encounter (Signed)
STAT if patient feels like he/she is going to faint   1) Are you dizzy now? No a half a hour ago  2) Do you feel faint or have you passed out? No   3) Do you have any other symptoms? It comes and gose and heart skipping beats  4) Have you checked your HR and BP (record if available)? 134/82 HR 65

## 2020-08-21 NOTE — Telephone Encounter (Signed)
She could wear a 2 week monitor and see me early in July.

## 2020-08-22 ENCOUNTER — Ambulatory Visit (INDEPENDENT_AMBULATORY_CARE_PROVIDER_SITE_OTHER): Payer: Medicare Other

## 2020-08-22 ENCOUNTER — Encounter: Payer: Self-pay | Admitting: *Deleted

## 2020-08-22 DIAGNOSIS — R002 Palpitations: Secondary | ICD-10-CM

## 2020-08-22 NOTE — Progress Notes (Unsigned)
Patient enrolled for [employer] to mail a 14 day ZIO XT monitor to her home. 

## 2020-08-22 NOTE — Telephone Encounter (Signed)
Spoke with pt, aware of dr hochrein's recommendations. Order placed and Follow up scheduled

## 2020-08-25 DIAGNOSIS — R002 Palpitations: Secondary | ICD-10-CM

## 2020-09-21 NOTE — Progress Notes (Signed)
Cardiology Office Note   Date:  09/22/2020   ID:  Alexandria, Mercado 1955/04/07, MRN [id number]  PCP:  Alexandria Hy, PA-C  Cardiologist:   Alexandria Breeding, MD   Chief Complaint  Patient presents with   Palpitations      History of Present Illness: Alexandria Mercado is a 65 y.o. female who ifollows up for evaluation of HTN and an abnormal EKG.    She had some palpitations but there were no arrhythmias on recent monitor.  She had EKG changes consistent with repolarization changes but recent echo was normal.      Since I last saw her she has continued to have some palpitations.  She really feels skipping beats rather than the nonsustained supraventricular and ventricular tachycardia which were noted.  She had brief runs of nonsustained ventricular tachycardia.  She would have 11 seconds runs of supraventricular tachycardia but she really did not notice any of this.  She is not having any new chest pressure, neck or arm discomfort.  She is not having any new shortness of breath, PND or orthopnea.  She has been very sedentary.  She did have her beta-blocker increased a few months ago because of higher blood pressures.  She does describe some dizziness on standing occasionally.   Past Medical History:  Diagnosis Date   Anxiety    Chronic RLQ pain 1989   secondary to adhesions   Depression    grief counseling   Diverticulosis of colon (without mention of hemorrhage)    Esophageal obstruction    GERD with stricture    Hx of adenomatous colonic polyps 10/07/2015   Hyperlipidemia    NO MEDS   Hypertension    Osteopenia    Pneumonia    PONV (postoperative nausea and vomiting)    Premature menopause age 56   On HRT age 42 - 2007   Smoker    Trigger thumb of right hand 09/2016   Vitamin D deficiency     Past Surgical History:  Procedure Laterality Date   BREAST EXCISIONAL BIOPSY Right 06/11/2006   BUNIONECTOMY Right    CHEILECTOMY Right 9/06   great toe    COLONOSCOPY   1988   normal   COLONOSCOPY  9/98   normal   COLONOSCOPY  11/03   normal   COLONOSCOPY W/ BIOPSIES  03/2008   mild diverticula - recheck 7 years   ESOPHAGOGASTRODUODENOSCOPY N/A 11/08/2016   Procedure: ESOPHAGOGASTRODUODENOSCOPY (EGD);  Surgeon: Alexandria Mayer, MD;  Location: [facility];  Service: Endoscopy;  Laterality: N/A;   HYSTEROSCOPY  2003   LAPAROSCOPIC OVARIAN CYSTECTOMY Right 1985   MM BREAST STEREO BX*L*R/S Right 06/2006    bloody nipple discharge - path reveals intraductal papilloma Dr. Arlyce Dice FINGER RELEASE Right 09/18/2016   Procedure: RELEASE TRIGGER FINGER/A-1 PULLEY RIGHT THUMB;  Surgeon: Alexandria Brod, MD;  Location: [facility];  Service: Orthopedics;  Laterality: Right;  REG/FAB   UPPER GASTROINTESTINAL ENDOSCOPY       Current Outpatient Medications  Medication Sig Dispense Refill   ALPRAZolam (XANAX) 0.5 MG tablet Take 0.25-0.5 mg by mouth every 8 (eight) hours as needed.     AMBULATORY NON FORMULARY MEDICATION Medication Name: immune factors twice daily     ascorbic acid (VITAMIN C) 500 MG tablet Take by mouth daily.     ASPIRIN 81 PO Take by mouth.     Calcium-Magnesium-Vitamin D (CALCIUM MAGNESIUM PO) Take by mouth.     Cranberry  400 MG CAPS Take by mouth daily.     cyclobenzaprine (FLEXERIL) 10 MG tablet TAKE 1 TABLET EVERY DAY IF NEEDED  0   Famotidine (PEPCID AC PO) Take by mouth every other day.      FIBER PO Take by mouth daily.     glucosamine-chondroitin 500-400 MG tablet Take 1 tablet by mouth daily.     hydrochlorothiazide (HYDRODIURIL) 25 MG tablet TAKE 1 TABLET BY MOUTH EVERY DAY IN THE MORNING  1   ibuprofen (ADVIL,MOTRIN) 400 MG tablet Take 400 mg by mouth every 6 (six) hours as needed.     Multiple Vitamin (MULTIVITAMIN) tablet Take 1 tablet by mouth daily.     pantoprazole (PROTONIX) 40 MG tablet Take 1 tablet (40 mg total) by mouth daily before breakfast. 90 tablet 3   TURMERIC PO Take by mouth.      UNABLE TO FIND  Ultimate eye support     valACYclovir (VALTREX) 500 MG tablet Take 1 tablet (500 mg total) by mouth daily. Take 1 tab daily for suppressive therapy and increase to bid x 3 days with any new symptoms 90 tablet 3   VITAMIN D PO Take 3,000 Int'l Units by mouth daily.     metoprolol succinate (TOPROL-XL) 50 MG 24 hr tablet Take 50 mg by mouth daily.     No current facility-administered medications for this visit.    Allergies:   Amoxicillin-pot clavulanate, Ceclor [cefaclor], Codeine, Doxycycline, and Keflex [cephalexin]    ROS:  Please see the history of present illness.   Otherwise, review of systems are positive for none.   All other systems are reviewed and negative.    PHYSICAL EXAM: VS:  BP 122/84   Pulse 86   Ht 5\' 8"  (1.727 m)   Wt 232 lb 6.4 oz (105.4 kg)   LMP 09/17/1990 (Approximate)   SpO2 97%   BMI 35.34 kg/m  , BMI Body mass index is 35.34 kg/m. GENERAL:  Well appearing NECK:  No jugular venous distention, waveform within normal limits, carotid upstroke brisk and symmetric, no bruits, no thyromegaly LUNGS:  Clear to auscultation bilaterally CHEST:  Unremarkable HEART:  PMI not displaced or sustained,S1 and S2 within normal limits, no S3, no S4, no clicks, no rubs, no murmurs ABD:  Flat, positive bowel sounds normal in frequency in pitch, no bruits, no rebound, no guarding, no midline pulsatile mass, no hepatomegaly, no splenomegaly EXT:  2 plus pulses throughout, no edema, no cyanosis no clubbing   EKG:  EKG is  ordered today. The ekg ordered today demonstrates sinus rhythm, rate 68, axis within normal limits, intervals within normal limits, lateral T wave inversions more pronounced than previous   Recent Labs: No results found for requested labs within last 8760 hours.    Lipid Panel No results found for: CHOL, TRIG, HDL, CHOLHDL, VLDL, LDLCALC, LDLDIRECT    Wt Readings from Last 3 Encounters:  09/22/20 232 lb 6.4 oz (105.4 kg)  06/01/20 231 lb (104.8 kg)   06/05/19 222 lb (100.7 kg)      Other studies Reviewed: Additional studies/ records that were reviewed today include: None Review of the above records demonstrates:    ASSESSMENT AND PLAN:  HTN:   Her blood pressure is at target. No change in therapy.   PALPITATIONS: We talked about this at length.  For now she prefer not any further therapy as her not particularly bothersome.  I will be evaluating as below however.  ABNORMAL EKG: I suspect this  is related to repolarization changes.  There were no significant abnormalities on echo.  However, given the more pronounced T wave inversions in the anterior lateral leads as well as nonsustained ventricular tachycardia along with risk factors I would like to exclude obstructive coronary disease.  I am going to have her do a stress test but she would be able to walk on a treadmill.  Therefore, she will need a  [employer].   Current medicines are reviewed at length with the patient today.  The patient does not have concerns regarding medicines.  The following changes have been made:  None  Labs/ tests ordered today include:   Orders Placed This Encounter  Procedures   MYOCARDIAL PERFUSION IMAGING   EKG 12-Lead      Disposition:   FU with me as needed.     Signed, Alexandria Breeding, MD  09/22/2020 9:31 AM    [facility]

## 2020-09-22 ENCOUNTER — Ambulatory Visit: Payer: Medicare Other | Admitting: Cardiology

## 2020-09-22 ENCOUNTER — Encounter: Payer: Self-pay | Admitting: Cardiology

## 2020-09-22 ENCOUNTER — Other Ambulatory Visit: Payer: Self-pay

## 2020-09-22 VITALS — BP 122/84 | HR 86 | Ht 68.0 in | Wt 232.4 lb

## 2020-09-22 DIAGNOSIS — R9431 Abnormal electrocardiogram [ECG] [EKG]: Secondary | ICD-10-CM | POA: Diagnosis not present

## 2020-09-22 DIAGNOSIS — R002 Palpitations: Secondary | ICD-10-CM

## 2020-09-22 DIAGNOSIS — I10 Essential (primary) hypertension: Secondary | ICD-10-CM | POA: Diagnosis not present

## 2020-09-22 NOTE — Patient Instructions (Addendum)
Medication Instructions:  Your physician recommends that you continue on your current medications as directed. Please refer to the Current Medication list given to you today.   *If you need a refill on your cardiac medications before your next appointment, please call your pharmacy*  Lab Work: NONE   Testing/Procedures: Your physician has requested that you have a lexiscan myoview. For further information please visit [email or web address]. Please follow instruction sheet, as given.    Follow-Up: At [facility], you and your health needs are our priority.  As part of our continuing mission to provide you with exceptional heart care, we have created designated Provider Care Teams.  These Care Teams include your primary Cardiologist (physician) and Advanced Practice Providers (APPs -  Physician Assistants and Nurse Practitioners) who all work together to provide you with the care you need, when you need it.  We recommend signing up for the patient portal called "MyChart".  Sign up information is provided on this After Visit Summary.  MyChart is used to connect with patients for Virtual Visits (Telemedicine).  Patients are able to view lab/test results, encounter notes, upcoming appointments, etc.  Non-urgent messages can be sent to your provider as well.   To learn more about what you can do with MyChart, go to [email or web address].    Your next appointment:   4 month(s)  The format for your next appointment:   In Person  Provider:   You may see Minus Breeding, MD\ or one of the following Advanced Practice Providers on your designated Care Team:   Rosaria Ferries, PA-C Jory Sims, DNP, ANP

## 2020-10-04 ENCOUNTER — Telehealth (HOSPITAL_COMMUNITY): Payer: Self-pay

## 2020-10-04 NOTE — Telephone Encounter (Signed)
Detailed instructions left on the patient's answering machine. Asked to call back with any questions. S.Ediberto Sens EMTP 

## 2020-10-06 ENCOUNTER — Ambulatory Visit (HOSPITAL_COMMUNITY): Payer: Medicare Other | Attending: Internal Medicine

## 2020-10-06 ENCOUNTER — Other Ambulatory Visit: Payer: Self-pay

## 2020-10-06 DIAGNOSIS — R9431 Abnormal electrocardiogram [ECG] [EKG]: Secondary | ICD-10-CM | POA: Diagnosis not present

## 2020-10-06 DIAGNOSIS — I10 Essential (primary) hypertension: Secondary | ICD-10-CM | POA: Diagnosis present

## 2020-10-06 DIAGNOSIS — R002 Palpitations: Secondary | ICD-10-CM | POA: Insufficient documentation

## 2020-10-06 LAB — MYOCARDIAL PERFUSION IMAGING
LV dias vol: 52 mL (ref 46–106)
LV sys vol: 16 mL
Peak HR: 83 {beats}/min
Rest HR: 61 {beats}/min
SDS: 1
SRS: 0
SSS: 1
TID: 1.03

## 2020-10-06 MED ORDER — TECHNETIUM TC 99M TETROFOSMIN IV KIT
30.7000 | PACK | Freq: Once | INTRAVENOUS | Status: AC | PRN
Start: 2020-10-06 — End: 2020-10-06
  Administered 2020-10-06: 30.7 via INTRAVENOUS
  Filled 2020-10-06: qty 31

## 2020-10-06 MED ORDER — TECHNETIUM TC 99M TETROFOSMIN IV KIT
11.0000 | PACK | Freq: Once | INTRAVENOUS | Status: AC | PRN
Start: 2020-10-06 — End: 2020-10-06
  Administered 2020-10-06: 11 via INTRAVENOUS
  Filled 2020-10-06: qty 11

## 2020-10-06 MED ORDER — REGADENOSON 0.4 MG/5ML IV SOLN
0.4000 mg | Freq: Once | INTRAVENOUS | Status: AC
  Administered 2020-10-06: 0.4 mg via INTRAVENOUS

## 2020-10-12 ENCOUNTER — Other Ambulatory Visit: Payer: Self-pay | Admitting: Internal Medicine

## 2020-10-24 ENCOUNTER — Other Ambulatory Visit: Payer: Self-pay

## 2020-10-24 MED ORDER — VALACYCLOVIR HCL 500 MG PO TABS: ORAL_TABLET | ORAL | 0 refills | Status: DC

## 2020-10-24 NOTE — Telephone Encounter (Addendum)
Last AEX was 05/26/19 w Dr. Sabra Heck. Scheduled 11/23/20 with TW.  Patient called today because her Valtrex Rx has expired and she is in need of it now.

## 2020-11-18 ENCOUNTER — Ambulatory Visit: Payer: Medicare Other

## 2020-11-23 ENCOUNTER — Other Ambulatory Visit: Payer: Self-pay

## 2020-11-23 ENCOUNTER — Encounter: Payer: Self-pay | Admitting: Nurse Practitioner

## 2020-11-23 ENCOUNTER — Ambulatory Visit (INDEPENDENT_AMBULATORY_CARE_PROVIDER_SITE_OTHER): Payer: Medicare Other | Admitting: Nurse Practitioner

## 2020-11-23 VITALS — BP 124/82 | Ht 68.0 in | Wt 232.0 lb

## 2020-11-23 DIAGNOSIS — M85852 Other specified disorders of bone density and structure, left thigh: Secondary | ICD-10-CM | POA: Diagnosis not present

## 2020-11-23 DIAGNOSIS — Z780 Asymptomatic menopausal state: Secondary | ICD-10-CM

## 2020-11-23 DIAGNOSIS — Z01419 Encounter for gynecological examination (general) (routine) without abnormal findings: Secondary | ICD-10-CM

## 2020-11-23 DIAGNOSIS — Z1501 Genetic susceptibility to malignant neoplasm of breast: Secondary | ICD-10-CM

## 2020-11-23 DIAGNOSIS — Z9189 Other specified personal risk factors, not elsewhere classified: Secondary | ICD-10-CM

## 2020-11-23 NOTE — Progress Notes (Addendum)
   Alexandria Mercado 1956-01-29 [id number]   History:  65 y.o. G0 presents for breast and pelvic exam. Postmenopausal - no HRT, no bleeding. Normal pap and mammogram history. HSV 2, takes Valtrex as needed. Being followed by GI for umbilical hernia, cardiology managing HTN, HLD, palpitations.   Gynecologic History Patient's last menstrual period was 09/17/1990 (approximate).   Contraception: post menopausal status  Health Maintenance Last Pap: 06/11/2017. Results were: Normal, 5-year repeat Last mammogram: 01/29/2020. Results were: Normal Last colonoscopy: 03/31/2019. Results were: 5 adenomas, 3-year recall. (2017 adenomas x 4) Last Dexa: 06/16/2020. Results were: T-score -1.2, FRAX 8.0% / 0.7%  Past medical history, past surgical history, family history and social history were all reviewed and documented in the EPIC chart. Married. Retired.   ROS:  A ROS was performed and pertinent positives and negatives are included.  Exam:  Vitals:   11/23/20 0854  BP: 124/82  Weight: 232 lb (105.2 kg)  Height: '5\' 8"'$  (1.727 m)   Body mass index is 35.28 kg/m.  General appearance:  Normal Thyroid:  Symmetrical, normal in size, without palpable masses or nodularity. Respiratory  Auscultation:  Clear without wheezing or rhonchi Cardiovascular  Auscultation:  Regular rate, without rubs, murmurs or gallops  Edema/varicosities:  Not grossly evident Abdominal  Soft,nontender, without masses, guarding or rebound.  Liver/spleen:  No organomegaly noted  Hernia:  umbilical present  Skin  Inspection:  Grossly normal Breasts: Examined lying and sitting.   Right: Without masses, retractions, nipple discharge or axillary adenopathy.   Left: Without masses, retractions, nipple discharge or axillary adenopathy. Genitourinary   Inguinal/mons:  Normal without inguinal adenopathy  External genitalia:  Normal appearing vulva with no masses, tenderness, or lesions  BUS/Urethra/Skene's glands:   Normal  Vagina:  Atrophic changes  Cervix:  Normal appearing without discharge or lesions  Uterus: Difficult to palpate due to body habitus but no gross masses or tenderness  Adnexa/parametria:     Rt: Normal in size, without masses or tenderness.   Lt: Normal in size, without masses or tenderness.  Anus and perineum: Normal  Digital rectal exam: Normal sphincter tone without palpated masses or tenderness  Patient informed chaperone available to be present for breast and pelvic exam. Patient has requested no chaperone to be present. Patient has been advised what will be completed during breast and pelvic exam.   Assessment/Plan:  65 y.o. G0 for breast and pelvic exam.   Well female exam with routine gynecological exam - Education provided on SBEs, importance of preventative screenings, current guidelines, high calcium diet, regular exercise, and multivitamin daily. Labs with PCP.   Postmenopausal - no HRT, no bleeding.   Osteopenia of neck of left femur - T=score -1.2 without elevated FRAX. Continue Vitamin D + calcium supplement and exercise regularly.   Other specified personal risk factors, not elsewhere classified - HSV  Screening for cervical cancer - Normal Pap history. Discussed current guidelines and option to stop screening and she is agreeable.   Screening for breast cancer - Normal mammogram history.  Continue annual screenings.  Normal breast exam today.  Screening for colon cancer - 2021 colonoscopy. Will repeat at GI's recommended interval.   Return in 1 year for breast and pelvic exam.   Tamela Gammon DNP, 9:24 AM 11/23/2020

## 2020-12-06 ENCOUNTER — Other Ambulatory Visit: Payer: Self-pay | Admitting: Nurse Practitioner

## 2020-12-06 NOTE — Telephone Encounter (Signed)
AEX 11/23/20.  ?

## 2021-01-29 DIAGNOSIS — R9431 Abnormal electrocardiogram [ECG] [EKG]: Secondary | ICD-10-CM | POA: Insufficient documentation

## 2021-01-29 DIAGNOSIS — R002 Palpitations: Secondary | ICD-10-CM | POA: Insufficient documentation

## 2021-01-29 NOTE — Progress Notes (Signed)
Cardiology Office Note   Date:  02/03/2021   ID:  Jenalyn, Girdner [birth year], MRN [id number]  PCP:  Simona Huh, NP  Cardiologist:   Minus Breeding, MD   Chief Complaint  Patient presents with   Follow-up   Abnormal ECG       History of Present Illness: Alexandria Mercado is a 65 y.o. female who ifollows up for evaluation of HTN and an abnormal EKG.    She had some palpitations but there were no arrhythmias on recent monitor.  She had EKG changes consistent with repolarization changes but recent echo was normal.    She had an abormal EKG and had a negative stress test in July.    Since I last saw her she has done well.  She has had a few tachycardia episodes.  The patient denies any new symptoms such as chest discomfort, neck or arm discomfort. There has been no new shortness of breath, PND or orthopnea. There have been no reported palpitations, presyncope or syncope.  She has not been exercising as much as I would like.     Past Medical History:  Diagnosis Date   Anxiety    Chronic RLQ pain 1989   secondary to adhesions   Depression    grief counseling   Diverticulosis of colon (without mention of hemorrhage)    Esophageal obstruction    GERD with stricture    Hx of adenomatous colonic polyps 10/07/2015   Hyperlipidemia    NO MEDS   Hypertension    Osteopenia    Pneumonia    PONV (postoperative nausea and vomiting)    Premature menopause age 27   On HRT age 9 - 2007   Smoker    Trigger thumb of right hand 09/2016   Vitamin D deficiency     Past Surgical History:  Procedure Laterality Date   BREAST EXCISIONAL BIOPSY Right 06/11/2006   BUNIONECTOMY Right    CHEILECTOMY Right 9/06   great toe    COLONOSCOPY  1988   normal   COLONOSCOPY  9/98   normal   COLONOSCOPY  11/03   normal   COLONOSCOPY W/ BIOPSIES  03/2008   mild diverticula - recheck 7 years   ESOPHAGOGASTRODUODENOSCOPY N/A 11/08/2016   Procedure: ESOPHAGOGASTRODUODENOSCOPY (EGD);   Surgeon: Gatha Mayer, MD;  Location: [facility];  Service: Endoscopy;  Laterality: N/A;   HYSTEROSCOPY  2003   LAPAROSCOPIC OVARIAN CYSTECTOMY Right 1985   MM BREAST STEREO BX*L*R/S Right 06/2006    bloody nipple discharge - path reveals intraductal papilloma Dr. Arlyce Dice FINGER RELEASE Right 09/18/2016   Procedure: RELEASE TRIGGER FINGER/A-1 PULLEY RIGHT THUMB;  Surgeon: Daryll Brod, MD;  Location: [place];  Service: Orthopedics;  Laterality: Right;  REG/FAB   UPPER GASTROINTESTINAL ENDOSCOPY       Current Outpatient Medications  Medication Sig Dispense Refill   ALPRAZolam (XANAX) 0.5 MG tablet Take 0.25-0.5 mg by mouth every 8 (eight) hours as needed.     AMBULATORY NON FORMULARY MEDICATION Medication Name: immune factors twice daily     ascorbic acid (VITAMIN C) 500 MG tablet Take by mouth daily.     ASPIRIN 81 PO Take by mouth.     benzonatate (TESSALON) 100 MG capsule benzonatate 100 mg capsule  TAKE 1 (ONE) CAPSULE BY MOUTH FOUR TIMES DAILY, AS NEEDED     Cranberry 400 MG CAPS Take by mouth daily.     cyclobenzaprine (FLEXERIL) 10 MG tablet  TAKE 1 TABLET EVERY DAY IF NEEDED  0   FIBER PO Take by mouth daily.     glucosamine-chondroitin 500-400 MG tablet Take 1 tablet by mouth daily.     hydrochlorothiazide (HYDRODIURIL) 25 MG tablet TAKE 1 TABLET BY MOUTH EVERY DAY IN THE MORNING  1   ibuprofen (ADVIL,MOTRIN) 400 MG tablet Take 400 mg by mouth every 6 (six) hours as needed.     metoprolol succinate (TOPROL-XL) 50 MG 24 hr tablet Take 50 mg by mouth daily.     Multiple Vitamin (MULTIVITAMIN) tablet Take 1 tablet by mouth daily.     pantoprazole (PROTONIX) 40 MG tablet TAKE ONE TABLET BY MOUTH DAILY BEFORE BREAKFAST 90 tablet 3   TURMERIC PO Take by mouth.      UNABLE TO FIND Ultimate eye support     valACYclovir (VALTREX) 500 MG tablet TAKE ONE TABLET BY MOUTH DAILY FOR SUPPRESSIVE THERAPY AND INCREASE TO TWO TIMES A DAY FOR THREE DAYS WITH ANY NEW  SYMPTOMS 90 tablet 0   VITAMIN D PO Take 3,000 Int'l Units by mouth daily.     Calcium-Magnesium-Vitamin D (CALCIUM MAGNESIUM PO) Take by mouth. (Patient not taking: Reported on 02/03/2021)     No current facility-administered medications for this visit.    Allergies:   Amoxicillin-pot clavulanate, Ceclor [cefaclor], Codeine, Doxycycline, and Keflex [cephalexin]    ROS:  Please see the history of present illness.   Otherwise, review of systems are positive for none.   All other systems are reviewed and negative.    PHYSICAL EXAM: VS:  BP 128/60   Pulse 85   Ht 5\' 8"  (1.727 m)   Wt 232 lb 9.6 oz (105.5 kg)   LMP 09/17/1990 (Approximate)   SpO2 97%   BMI 35.37 kg/m  , BMI Body mass index is 35.37 kg/m. GENERAL:  Well appearing NECK:  No jugular venous distention, waveform within normal limits, carotid upstroke brisk and symmetric, no bruits, no thyromegaly LUNGS:  Clear to auscultation bilaterally CHEST:  Unremarkable HEART:  PMI not displaced or sustained,S1 and S2 within normal limits, no S3, no S4, no clicks, no rubs, no murmurs ABD:  Flat, positive bowel sounds normal in frequency in pitch, no bruits, no rebound, no guarding, no midline pulsatile mass, no hepatomegaly, no splenomegaly EXT:  2 plus pulses throughout, no edema, no cyanosis no clubbing   EKG:  EKG is not ordered today.   Recent Labs: No results found for requested labs within last 8760 hours.    Lipid Panel No results found for: CHOL, TRIG, HDL, CHOLHDL, VLDL, LDLCALC, LDLDIRECT    Wt Readings from Last 3 Encounters:  02/03/21 232 lb 9.6 oz (105.5 kg)  11/23/20 232 lb (105.2 kg)  10/06/20 232 lb (105.2 kg)      Other studies Reviewed: Additional studies/ records that were reviewed today include: None Review of the above records demonstrates:  NA  ASSESSMENT AND PLAN:  HTN:   Her blood pressure is at target.   PALPITATIONS: She has occasional mild rapid heartbeats but does well with her current  meds.  No change in therapy.   ABNORMAL EKG:    She had a negative Lexiscan Myoview.  He had a normal echocardiogram last year.  No further work-up.  Current medicines are reviewed at length with the patient today.  The patient does not have concerns regarding medicines.  The following changes have been made: None  Labs/ tests ordered today include: None  No orders of the  defined types were placed in this encounter.     Disposition:   FU with me as needed   Signed, Minus Breeding, MD  02/03/2021 7:58 AM    [facility]

## 2021-02-03 ENCOUNTER — Ambulatory Visit: Payer: Medicare Other | Admitting: Cardiology

## 2021-02-03 ENCOUNTER — Encounter: Payer: Self-pay | Admitting: Cardiology

## 2021-02-03 ENCOUNTER — Other Ambulatory Visit: Payer: Self-pay

## 2021-02-03 VITALS — BP 128/60 | HR 85 | Ht 68.0 in | Wt 232.6 lb

## 2021-02-03 DIAGNOSIS — R002 Palpitations: Secondary | ICD-10-CM | POA: Diagnosis not present

## 2021-02-03 DIAGNOSIS — R9431 Abnormal electrocardiogram [ECG] [EKG]: Secondary | ICD-10-CM | POA: Diagnosis not present

## 2021-02-03 NOTE — Patient Instructions (Signed)
Medication Instructions:  Continue same medications   Lab Work: None ordered  Testing/Procedures: None ordered   Follow-Up: At [facility], you and your health needs are our priority.  As part of our continuing mission to provide you with exceptional heart care, we have created designated Provider Care Teams.  These Care Teams include your primary Cardiologist (physician) and Advanced Practice Providers (APPs -  Physician Assistants and Nurse Practitioners) who all work together to provide you with the care you need, when you need it.  We recommend signing up for the patient portal called "MyChart".  Sign up information is provided on this After Visit Summary.  MyChart is used to connect with patients for Virtual Visits (Telemedicine).  Patients are able to view lab/test results, encounter notes, upcoming appointments, etc.  Non-urgent messages can be sent to your provider as well.   To learn more about what you can do with MyChart, go to [email or web address].      Your next appointment:  As Needed    The format for your next appointment:Office   Provider:  Dr.Hochrein

## 2021-03-29 ENCOUNTER — Telehealth: Payer: Self-pay

## 2021-03-29 NOTE — Telephone Encounter (Signed)
Patient called to inquire regarding recommended frequency of mammograms at her age 66.  She asked me to call back and leave the answer in her voice mail [phone]. I called her back and left message that annual mammogram still recommended at her age.

## 2021-03-31 ENCOUNTER — Encounter: Payer: Self-pay | Admitting: Nurse Practitioner

## 2021-07-18 ENCOUNTER — Encounter: Payer: Self-pay | Admitting: Physician Assistant

## 2021-07-18 ENCOUNTER — Ambulatory Visit: Payer: Medicare Other | Admitting: Physician Assistant

## 2021-07-18 VITALS — BP 132/86 | HR 75 | Ht 68.0 in | Wt 233.0 lb

## 2021-07-18 DIAGNOSIS — K5732 Diverticulitis of large intestine without perforation or abscess without bleeding: Secondary | ICD-10-CM | POA: Diagnosis not present

## 2021-07-18 DIAGNOSIS — K5904 Chronic idiopathic constipation: Secondary | ICD-10-CM | POA: Diagnosis not present

## 2021-07-18 MED ORDER — METRONIDAZOLE 500 MG PO TABS: 500.0000 mg | ORAL_TABLET | Freq: Two times a day (BID) | ORAL | 0 refills | Status: AC

## 2021-07-18 MED ORDER — CIPROFLOXACIN HCL 500 MG PO TABS: 500.0000 mg | ORAL_TABLET | Freq: Two times a day (BID) | ORAL | 0 refills | Status: AC

## 2021-07-18 NOTE — Progress Notes (Signed)
? ?Subjective:  ? ? Patient ID: Alexandria Mercado, female    DOB: 1955/12/16, 66 y.o.   MRN: [id number] ? ?HPI ?Alexandria Mercado is a 66 year old female, established with Dr. Carlean Purl, who comes in today with left lower abdominal pain.  She was last seen here in March 2022.  She has history of constipation, umbilical hernia, adenomatous colon polyps, diverticulosis, GERD with prior stricture and hypertension. ?She says she has been having pain in the left lower abdomen over the past couple of weeks, described as nagging in quality and intermittent but present every day.  Over the past couple of days she has felt a bit better.  She had been experiencing some cramping.  No fever or chills, no nausea or vomiting.  She does have history of chronic constipation and uses MiraLAX most days.  She has been using Senokot on a as needed basis every couple of days if needed, had tried Benefiber in the past which made her feel more gassy and uncomfortable.  No dysuria urgency or hematuria. ?She has been having ongoing issues with gas. ?Last colonoscopy January 2021 with removal of 5 sessile polyps, diminutive, noted to have multiple diverticuli in the left colon and sigmoid and external and internal hemorrhoids.  Path on the polyps consistent with tubular adenomas and is indicated for 3-year interval follow-up in January 2024. ? ? ?Review of Systems.Pertinent positive and negative review of systems were noted in the above HPI section.  All other review of systems was otherwise negative.  ? ?Outpatient Encounter Medications as of 07/18/2021  ?Medication Sig  ? ALPRAZolam (XANAX) 0.5 MG tablet Take 0.25-0.5 mg by mouth every 8 (eight) hours as needed.  ? AMBULATORY NON FORMULARY MEDICATION Medication Name: immune factors twice daily  ? ascorbic acid (VITAMIN C) 500 MG tablet Take by mouth daily.  ? ASPIRIN 81 PO Take by mouth.  ? benzonatate (TESSALON) 100 MG capsule benzonatate 100 mg capsule ? TAKE 1 (ONE) CAPSULE BY MOUTH FOUR TIMES DAILY, AS  NEEDED  ? ciprofloxacin (CIPRO) 500 MG tablet Take 1 tablet (500 mg total) by mouth 2 (two) times daily for 7 days.  ? Cranberry 400 MG CAPS Take by mouth daily.  ? cyclobenzaprine (FLEXERIL) 10 MG tablet TAKE 1 TABLET EVERY DAY IF NEEDED  ? FIBER PO Take by mouth daily.  ? glucosamine-chondroitin 500-400 MG tablet Take 1 tablet by mouth daily.  ? hydrochlorothiazide (HYDRODIURIL) 25 MG tablet TAKE 1 TABLET BY MOUTH EVERY DAY IN THE MORNING  ? ibuprofen (ADVIL,MOTRIN) 400 MG tablet Take 400 mg by mouth every 6 (six) hours as needed.  ? metoprolol succinate (TOPROL-XL) 50 MG 24 hr tablet Take 50 mg by mouth daily.  ? metroNIDAZOLE (FLAGYL) 500 MG tablet Take 1 tablet (500 mg total) by mouth 2 (two) times daily for 7 days.  ? Multiple Vitamin (MULTIVITAMIN) tablet Take 1 tablet by mouth daily.  ? pantoprazole (PROTONIX) 40 MG tablet TAKE ONE TABLET BY MOUTH DAILY BEFORE BREAKFAST  ? TURMERIC PO Take by mouth.   ? UNABLE TO FIND Ultimate eye support  ? valACYclovir (VALTREX) 500 MG tablet TAKE ONE TABLET BY MOUTH DAILY FOR SUPPRESSIVE THERAPY AND INCREASE TO TWO TIMES A DAY FOR THREE DAYS WITH ANY NEW SYMPTOMS  ? VITAMIN D PO Take 4,000 Int'l Units by mouth daily.  ? [DISCONTINUED] Calcium-Magnesium-Vitamin D (CALCIUM MAGNESIUM PO) Take by mouth. (Patient not taking: Reported on 02/03/2021)  ? ?No facility-administered encounter medications on file as of 07/18/2021.  ? ?Allergies  ?  Allergen Reactions  ? Amoxicillin-Pot Clavulanate   ?  REACTION: rash  ? Ceclor [Cefaclor]   ?  REACTION: rash  ? Codeine   ?  REACTION: nausea  ? Doxycycline   ?  Slight itching  ? Keflex [Cephalexin]   ?  itching  ? ?Patient Active Problem List  ? Diagnosis Date Noted  ? Nonspecific abnormal electrocardiogram (ECG) (EKG) 01/29/2021  ? Palpitations 01/29/2021  ? Educated about COVID-19 virus infection 04/19/2019  ? Hypertension 12/04/2018  ? High cholesterol 12/04/2018  ? Anxiety 12/04/2018  ? Obesity (BMI 30.0-34.9) 01/14/2017  ? GERD with  stricture   ? Trigger finger of right thumb 08/31/2016  ? Bilateral hand pain 04/18/2016  ? Contracture of palmar fascia 04/18/2016  ? Trigger ring finger of left hand 04/18/2016  ? Hx of adenomatous colonic polyps 10/07/2015  ? Generalized anxiety disorder 05/31/2015  ? Vitamin D deficiency 05/31/2015  ? Premature ovarian failure 05/31/2015  ? ?Social History  ? ?Socioeconomic History  ? Marital status: Married  ?  Spouse name: Not on file  ? Number of children: Not on file  ? Years of education: Not on file  ? Highest education level: Not on file  ?Occupational History  ? Not on file  ?Tobacco Use  ? Smoking status: Former  ?  Types: Cigarettes  ?  Quit date: 03/19/2000  ?  Years since quitting: 21.3  ? Smokeless tobacco: Never  ?Vaping Use  ? Vaping Use: Never used  ?Substance and Sexual Activity  ? Alcohol use: No  ?  Alcohol/week: 0.0 standard drinks  ? Drug use: No  ? Sexual activity: Not Currently  ?  Partners: Male  ?  Birth control/protection: Post-menopausal  ?Other Topics Concern  ? Not on file  ?Social History Narrative  ? 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    ? ?Social Determinants of Health  ? ?Financial Resource Strain: Not on file  ?Food Insecurity: Not on file  ?Transportation Needs: Not on file  ?Physical Activity: Not on file  ?Stress: Not on file  ?Social Connections: Not on file  ?Intimate Partner Violence: Not on file  ? ? ?Ms. Vahle's family history includes Colon cancer in an other family member; Colon cancer (age of onset: 1) in her maternal uncle; Heart failure in her maternal grandfather and mother; Hyperlipidemia in her mother; Hypertension in her brother. ? ? ?   ?Objective:  ?  ?Vitals:  ? 07/18/21 0858  ?BP: 132/86  ?Pulse: 75  ?SpO2: 98%  ? ? ?Physical Exam Well-developed well-nourished  older WF in no acute distress.  Height, [id number] , BMI 35.4 ? ?HEENT; nontraumatic normocephalic, EOMI, PE R LA, sclera anicteric. ?Oropharynx;not examined ?Neck; supple, no  JVD ?Cardiovascular; regular rate and rhythm with S1-S2, no murmur rub or gallop ?Pulmonary; Clear bilaterally ?Abdomen; soft, she is tender in the left lower quadrant, no rebound nondistended, no palpable mass or hepatosplenomegaly, bowel sounds are active ?Rectal; not done today ?Skin; benign exam, no jaundice rash or appreciable lesions ?Extremities; no clubbing cyanosis or edema skin warm and dry ?Neuro/Psych; alert and oriented x4, grossly nonfocal mood and affect appropriate  ? ? ? ?   ?Assessment & Plan:  ? ?#33 66 year old white female with 2-week history of nagging left lower quadrant pain/discomfort, has felt a little bit better over the past 2 days but has definite tenderness in the left lower quadrant.  I think she has mild diverticulitis. ? ?#2 chronic constipation-stable, managed with  MiraLAX and Senokot ?#3 history of adenomatous colon polyps-up-to-date with colonoscopy, due for interval follow-up January 2024 ?#4 GERD chronic stable on Protonix 40 mg p.o. every morning ?#5 gas ? ?Plan; start Cipro 500 mg p.o. twice daily x7 days and metronidazole 500 mg p.o. twice daily x7 days both to be taken with food. ?She will continue MiraLAX 17 g in 8 ounces of water daily, and okay to use Senokot as needed ?Of asked her to call back after she completes the antibiotics if her symptoms have not completely resolved or if symptoms worsen while she is on antibiotics. ? ?For gas we discussed a lower gas diet, and she was given a copy.  She has been drinking carbonated water regularly, was asked to discontinue carbonated beverages and avoid artificial sweeteners, can use Gas-X or Phazyme as needed. ? ? ?Aimi Essner S Dagmawi Venable PA-C ?07/18/2021 ? ? ?Cc: Simona Huh, NP ?  ?

## 2021-07-18 NOTE — Patient Instructions (Addendum)
If you are age 68 or older, your body mass index should be between 23-30. Your Body mass index is 35.43 kg/m?Marland Kitchen If this is out of the aforementioned range listed, please consider follow up with your Primary Care Provider. ?________________________________________________________ ? ?The [facility] providers would like to encourage you to use [facility] to communicate with providers for non-urgent requests or questions.  Due to long hold times on the telephone, sending your provider a message by [facility] may be a faster and more efficient way to get a response.  Please allow 48 business hours for a response.  Please remember that this is for non-urgent requests.  ?_______________________________________________________ ? ?START flagyl 500 mg 1 tablet twice daily for 1 week ?START Cipro 500 mg 1 tablet twice daily for 1 week ? ?Use Gas X as needed ? ?Decrease carbonated drinks  ?Avoid artificial sweeteners ? ?Call the office and speak with [facility] if you have completed your antibiotics and symptoms persist . ? ?You will be due for your Colonoscopy in January of 2024. ? ?Follow up as needed. ? ?Thank you for entrusting me with your care and choosing [facility]. ? ?Amy Esterwood, PA-C ? ?

## 2021-07-26 ENCOUNTER — Encounter: Payer: Self-pay | Admitting: Nurse Practitioner

## 2021-07-26 ENCOUNTER — Ambulatory Visit: Payer: Medicare Other | Admitting: Nurse Practitioner

## 2021-07-26 VITALS — BP 112/78 | HR 95

## 2021-07-26 DIAGNOSIS — R1032 Left lower quadrant pain: Secondary | ICD-10-CM

## 2021-07-26 DIAGNOSIS — Z9889 Other specified postprocedural states: Secondary | ICD-10-CM

## 2021-07-26 DIAGNOSIS — Z8742 Personal history of other diseases of the female genital tract: Secondary | ICD-10-CM

## 2021-07-26 NOTE — Progress Notes (Signed)
? ?  Acute Office Visit ? ?Subjective:  ? ? Patient ID: Alexandria Mercado, female    DOB: 18-Apr-1955, 66 y.o.   MRN: [id number] ? ? ?HPI ?66 y.o. presents today for LLQ abdominal pain x 3 weeks.  Reports 2-3 ovarian cyst removals in the past and she feels the pain is similar to when she had cysts. It is positional, worse with bending or lying on left side, "pinching". Was treated for suspected diverticulitis by GI 07/18/2021, completed course of antibiotics and reports no improvement in symptoms. She has had some diarrhea since being on antibiotics, but prior to that she had no changes in her bowels. No vaginal bleeding.  ? ? ?Review of Systems  ?Constitutional: Negative.   ?Gastrointestinal:  Positive for abdominal pain and diarrhea. Negative for blood in stool, constipation, nausea and vomiting.  ?Genitourinary: Negative.   ? ?   ?Objective:  ?  ?Physical Exam ?Constitutional:   ?   Appearance: Normal appearance.  ?Abdominal:  ?   General: Bowel sounds are normal.  ?   Palpations: Abdomen is soft.  ?   Tenderness: There is abdominal tenderness in the left lower quadrant. There is guarding. There is no rebound.  ?   Hernia: A hernia is present. Hernia is present in the umbilical area.  ?Genitourinary: ?   General: Normal vulva.  ?   Uterus: Normal.   ?   Adnexa: Right adnexa normal.    ?   Left: Tenderness present. No mass or fullness.    ? ? ?BP 112/78   Pulse 95   LMP 09/17/1990 (Approximate)   SpO2 95%  ?Wt Readings from Last 3 Encounters:  ?07/18/21 233 lb (105.7 kg)  ?02/03/21 232 lb 9.6 oz (105.5 kg)  ?11/23/20 232 lb (105.2 kg)  ? ? ?   ? ?Patient informed chaperone available to be present for breast and pelvic exam. Patient has requested no chaperone to be present. Patient has been advised what will be completed during breast and pelvic exam.  ? ?Assessment & Plan:  ? ?Problem List Items Addressed This Visit   ?None ?Visit Diagnoses   ? ? Left lower quadrant abdominal pain    -  Primary  ? Relevant Orders  ? US  PELVIS TRANSVAGINAL NON-OB (TV ONLY)  ? History of ovarian cystectomy      ? Relevant Orders  ? US PELVIS TRANSVAGINAL NON-OB (TV ONLY)  ? ?  ? ?Plan: Will schedule pelvic ultrasound for further evaluation.  ? ? ? ? ?[place], 12:19 PM 07/26/2021 ? ?

## 2021-07-28 ENCOUNTER — Telehealth: Payer: Self-pay | Admitting: Physician Assistant

## 2021-07-28 NOTE — Telephone Encounter (Signed)
Alexandria Mercado the pt was seen by you on 5/3 and was prescribed cipro and flagyl.  She now has thrush and would like nystatin prescribed.  Please advise. ?

## 2021-07-28 NOTE — Telephone Encounter (Signed)
We received a call from patient stating she's done with antibiotics but now has thrush on roof of mouth. Pharmacist advised nystatin. Please advise. [place]  ?

## 2021-07-31 ENCOUNTER — Other Ambulatory Visit: Payer: Self-pay

## 2021-07-31 MED ORDER — NYSTATIN 100000 UNIT/ML MT SUSP: OROMUCOSAL | 0 refills | Status: DC

## 2021-07-31 NOTE — Telephone Encounter (Signed)
Called the patient. No answer. Left her a message on her voicemail about the plan of care. Sent Rx to her pharmacy listed as [facility]. ?

## 2021-08-17 ENCOUNTER — Other Ambulatory Visit: Payer: Medicare Other | Admitting: Nurse Practitioner

## 2021-08-17 ENCOUNTER — Other Ambulatory Visit: Payer: Medicare Other

## 2021-10-13 ENCOUNTER — Telehealth: Payer: Self-pay | Admitting: *Deleted

## 2021-10-13 MED ORDER — VALACYCLOVIR HCL 500 MG PO TABS: ORAL_TABLET | ORAL | 1 refills | Status: DC

## 2021-10-13 NOTE — Telephone Encounter (Signed)
Patient called requesting refill on Valtrex 500 mg tablet, last annual exam 11/2020, not scheduled for future exam.

## 2021-11-29 ENCOUNTER — Other Ambulatory Visit: Payer: Self-pay | Admitting: Internal Medicine

## 2021-12-20 ENCOUNTER — Encounter (HOSPITAL_COMMUNITY): Payer: Self-pay | Admitting: Emergency Medicine

## 2021-12-20 ENCOUNTER — Ambulatory Visit (HOSPITAL_COMMUNITY)
Admission: EM | Admit: 2021-12-20 | Discharge: 2021-12-20 | Disposition: A | Discharge status: Home / Self Care | Payer: Medicare Other | Attending: Internal Medicine | Admitting: Internal Medicine

## 2021-12-20 DIAGNOSIS — U071 COVID-19: Secondary | ICD-10-CM | POA: Diagnosis present

## 2021-12-20 DIAGNOSIS — J069 Acute upper respiratory infection, unspecified: Secondary | ICD-10-CM

## 2021-12-20 LAB — POC INFLUENZA A AND B ANTIGEN (URGENT CARE ONLY)
INFLUENZA A ANTIGEN, POC: NEGATIVE
INFLUENZA B ANTIGEN, POC: NEGATIVE

## 2021-12-20 NOTE — ED Provider Notes (Signed)
[facility]   [id number] 12/20/21 Arrival Time: 0801  ASSESSMENT & PLAN:  1. Viral upper respiratory tract infection   -History and exam is consistent with viral upper respiratory illness.  No signs or symptoms of pneumonia on vitals or exam today.  Rapid flu was negative.  I suspect COVID.  COVID test pending.  We will call if positive.  Recommended continued vigorous p.o. hydration, over-the-counter cough and decongestant medications, and Tylenol/Motrin as needed.  All questions answered she agrees to plan.   No orders of the defined types were placed in this encounter.    Discharge Instructions      We will call you if COVID is positive Keep taking OTC meds as you are doing It was nice to meet you!      Follow-up Information     Simona Huh, NP.   Specialty: Nurse Practitioner Why: If symptoms worsen Contact information: [place] [address] [phone]                  Reviewed expectations re: course of current medical issues. Questions answered. Outlined signs and symptoms indicating need for more acute intervention. Patient verbalized understanding. After Visit Summary given.   SUBJECTIVE: Pleasant 66 year old female comes to urgent care for evaluation of fever, chills, body aches, nasal congestion.  She has had the symptoms for about 3 days.  Her Tmax was 100.3.  She generally has had aches, sneezing, and cough that strains her right shoulder.  The cough is productive of clear/yellow mucus.  She does not have any sick contacts.  She has been taking Tylenol, Mucinex, and Robitussin over-the-counter.  She denies any shortness of breath, wheezing, chest pain.  Denies any nausea/vomiting.  Has been vaccinated against COVID x2.  Patient's last menstrual period was 09/17/1990 (approximate). Past Surgical History:  Procedure Laterality Date   BREAST EXCISIONAL BIOPSY Right 06/11/2006   BUNIONECTOMY Right    CHEILECTOMY  Right 9/06   great toe    COLONOSCOPY  1988   normal   COLONOSCOPY  9/98   normal   COLONOSCOPY  11/03   normal   COLONOSCOPY W/ BIOPSIES  03/2008   mild diverticula - recheck 7 years   ESOPHAGOGASTRODUODENOSCOPY N/A 11/08/2016   Procedure: ESOPHAGOGASTRODUODENOSCOPY (EGD);  Surgeon: Gatha Mayer, MD;  Location: [facility];  Service: Endoscopy;  Laterality: N/A;   HYSTEROSCOPY  2003   LAPAROSCOPIC OVARIAN CYSTECTOMY Right 1985   MM BREAST STEREO BX*L*R/S Right 06/2006    bloody nipple discharge - path reveals intraductal papilloma Dr. Arlyce Dice FINGER RELEASE Right 09/18/2016   Procedure: RELEASE TRIGGER FINGER/A-1 PULLEY RIGHT THUMB;  Surgeon: Daryll Brod, MD;  Location: [facility];  Service: Orthopedics;  Laterality: Right;  REG/FAB   UPPER GASTROINTESTINAL ENDOSCOPY       OBJECTIVE:  Vitals:   12/20/21 0824  BP: 136/83  Pulse: 81  Resp: 16  Temp: 98.3 F (36.8 C)  TempSrc: Oral  SpO2: 96%     Physical Exam Vitals and nursing note reviewed.  Constitutional:      Appearance: Normal appearance. She is not ill-appearing.  HENT:     Head: Normocephalic.     Right Ear: Tympanic membrane normal.     Left Ear: Tympanic membrane normal.     Nose: Congestion present.     Mouth/Throat:     Mouth: Mucous membranes are moist.     Pharynx: Posterior oropharyngeal erythema present. No oropharyngeal exudate.  Cardiovascular:  Rate and Rhythm: Normal rate and regular rhythm.     Heart sounds: Normal heart sounds.  Pulmonary:     Effort: Pulmonary effort is normal. No respiratory distress.     Breath sounds: Normal breath sounds. No wheezing, rhonchi or rales.  Abdominal:     Palpations: Abdomen is soft.  Musculoskeletal:        General: Normal range of motion.     Cervical back: Normal range of motion.  Lymphadenopathy:     Cervical: Cervical adenopathy present.  Skin:    General: Skin is warm.  Neurological:     General: No focal deficit  present.  Psychiatric:        Mood and Affect: Mood normal.      Labs: Results for orders placed or performed in visit on 10/06/20  MYOCARDIAL PERFUSION IMAGING  Result Value Ref Range   Rest HR 61 bpm   Rest BP 153/83 mmHg   Peak HR 83 bpm   Peak BP 163/89 mmHg   SSS 1    SRS 0    SDS 1    TID 1.03    LV sys vol 16 mL   LV dias vol 52 46 - 106 mL   Labs Reviewed  SARS CORONAVIRUS 2 (TAT 6-24 HRS)  POC INFLUENZA A AND B ANTIGEN (URGENT CARE ONLY)    Imaging: No results found.   Allergies  Allergen Reactions   Amoxicillin-Pot Clavulanate     REACTION: rash   Ceclor [Cefaclor]     REACTION: rash   Codeine     REACTION: nausea   Doxycycline     Slight itching   Keflex [Cephalexin]     itching                                               Past Medical History:  Diagnosis Date   Anxiety    Chronic RLQ pain 1989   secondary to adhesions   Depression    grief counseling   Diverticulosis of colon (without mention of hemorrhage)    Esophageal obstruction    GERD with stricture    Hx of adenomatous colonic polyps 10/07/2015   Hyperlipidemia    NO MEDS   Hypertension    Osteopenia    Pneumonia    PONV (postoperative nausea and vomiting)    Premature menopause age 39   On HRT age 8 - 2007   Smoker    Trigger thumb of right hand 09/2016   Vitamin D deficiency     Social History   Socioeconomic History   Marital status: Married    Spouse name: Not on file   Number of children: Not on file   Years of education: Not on file   Highest education level: Not on file  Occupational History   Not on file  Tobacco Use   Smoking status: Former    Types: Cigarettes    Quit date: 03/19/2000    Years since quitting: 21.7   Smokeless tobacco: Never  Vaping Use   Vaping Use: Never used  Substance and Sexual Activity   Alcohol use: No    Alcohol/week: 0.0 standard drinks of alcohol   Drug use: No   Sexual activity: Not Currently    Partners: Male    Birth  control/protection: Post-menopausal  Other Topics Concern   Not on file  Social History Narrative   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     Social Determinants of Health   Financial Resource Strain: Not on file  Food Insecurity: Not on file  Transportation Needs: Not on file  Physical Activity: Not on file  Stress: Not on file  Social Connections: Not on file  Intimate Partner Violence: Not on file    Family History  Problem Relation Age of Onset   Colon cancer Maternal Uncle 50   Hyperlipidemia Mother    Heart failure Mother    Hypertension Brother    Heart failure Maternal Grandfather    Colon cancer Other    Esophageal cancer Neg Hx    Rectal cancer Neg Hx    Stomach cancer Neg Hx       Apolo Cutshaw, Dorian Pod, MD 12/20/21 [phone]

## 2021-12-20 NOTE — ED Triage Notes (Signed)
Pt reports fever, chills, body aches, chest congestion, sneezing and right shoulder blade pain x 3 days. States she had pneumonia years ago and had pleurisy and feels like shoulder blade pain feels similar. Has been taking tylenol for fever.

## 2021-12-20 NOTE — Discharge Instructions (Addendum)
We will call you if COVID is positive Keep taking OTC meds as you are doing It was nice to meet you!

## 2021-12-21 ENCOUNTER — Telehealth (HOSPITAL_COMMUNITY): Payer: Self-pay | Admitting: Emergency Medicine

## 2021-12-21 LAB — SARS CORONAVIRUS 2 (TAT 6-24 HRS): SARS Coronavirus 2: POSITIVE — AB

## 2021-12-21 MED ORDER — MOLNUPIRAVIR EUA 200MG CAPSULE: 4.0000 | ORAL_CAPSULE | Freq: Two times a day (BID) | ORAL | 0 refills | Status: AC

## 2021-12-23 NOTE — Progress Notes (Deleted)
Synopsis: Referred in October 2023 for morning mucus production  Subjective:   PATIENT ID: Alexandria Mercado GENDER: female DOB: 30-Sep-1955, MRN: [id number]   HPI  No chief complaint on file.   *** Record from in December 20, 2021 urgent care visit reviewed where she was noted to have [facility] records from earlier this year reviewed.  She was seen by Dr. Percival Spanish for hypertension management.  Notes indicate she had a negative echocardiogram in 2021 and had a negative Myoview scan at some point in the last few years  Past Medical History:  Diagnosis Date   Anxiety    Chronic RLQ pain 1989   secondary to adhesions   Depression    grief counseling   Diverticulosis of colon (without mention of hemorrhage)    Esophageal obstruction    GERD with stricture    Hx of adenomatous colonic polyps 10/07/2015   Hyperlipidemia    NO MEDS   Hypertension    Osteopenia    Pneumonia    PONV (postoperative nausea and vomiting)    Premature menopause age 37   On HRT age 3 - 2007   Smoker    Trigger thumb of right hand 09/2016   Vitamin D deficiency      Family History  Problem Relation Age of Onset   Colon cancer Maternal Uncle 77   Hyperlipidemia Mother    Heart failure Mother    Hypertension Brother    Heart failure Maternal Grandfather    Colon cancer Other    Esophageal cancer Neg Hx    Rectal cancer Neg Hx    Stomach cancer Neg Hx      Social History   Socioeconomic History   Marital status: Married    Spouse name: Not on file   Number of children: Not on file   Years of education: Not on file   Highest education level: Not on file  Occupational History   Not on file  Tobacco Use   Smoking status: Former    Types: Cigarettes    Quit date: 03/19/2000    Years since quitting: 21.7   Smokeless tobacco: Never  Vaping Use   Vaping Use: Never used  Substance and Sexual Activity   Alcohol use: No    Alcohol/week: 0.0 standard drinks of alcohol   Drug use: No    Sexual activity: Not Currently    Partners: Male    Birth control/protection: Post-menopausal  Other Topics Concern   Not on file  Social History Narrative   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     Social Determinants of Health   Financial Resource Strain: Not on file  Food Insecurity: Not on file  Transportation Needs: Not on file  Physical Activity: Not on file  Stress: Not on file  Social Connections: Not on file  Intimate Partner Violence: Not on file     Allergies  Allergen Reactions   Amoxicillin-Pot Clavulanate     REACTION: rash   Ceclor [Cefaclor]     REACTION: rash   Codeine     REACTION: nausea   Doxycycline     Slight itching   Keflex [Cephalexin]     itching     Outpatient Medications Prior to Visit  Medication Sig Dispense Refill   ALPRAZolam (XANAX) 0.5 MG tablet Take 0.25-0.5 mg by mouth every 8 (eight) hours as needed.     AMBULATORY NON FORMULARY MEDICATION Medication Name: immune factors twice daily  ascorbic acid (VITAMIN C) 500 MG tablet Take by mouth daily.     ASPIRIN 81 PO Take by mouth.     benzonatate (TESSALON) 100 MG capsule benzonatate 100 mg capsule  TAKE 1 (ONE) CAPSULE BY MOUTH FOUR TIMES DAILY, AS NEEDED     Cranberry 400 MG CAPS Take by mouth daily.     glucosamine-chondroitin 500-400 MG tablet Take 1 tablet by mouth daily.     hydrochlorothiazide (HYDRODIURIL) 25 MG tablet TAKE 1 TABLET BY MOUTH EVERY DAY IN THE MORNING  1   ibuprofen (ADVIL,MOTRIN) 400 MG tablet Take 400 mg by mouth every 6 (six) hours as needed.     metoprolol succinate (TOPROL-XL) 50 MG 24 hr tablet Take 50 mg by mouth daily.     molnupiravir EUA (LAGEVRIO) 200 mg CAPS capsule Take 4 capsules (800 mg total) by mouth 2 (two) times daily for 5 days. 40 capsule 0   Multiple Vitamin (MULTIVITAMIN) tablet Take 1 tablet by mouth daily.     mupirocin ointment (BACTROBAN) 2 %      nystatin (MYCOSTATIN) 100000 UNIT/ML suspension 5 ml swish QID and swallow x 2  weeks 300 mL 0   pantoprazole (PROTONIX) 40 MG tablet TAKE ONE TABLET BY MOUTH DAILY BEFORE BREAKFAST 90 tablet 1   tiZANidine (ZANAFLEX) 2 MG tablet 1/4-1 tablet up to 4 times a day     tiZANidine (ZANAFLEX) 4 MG tablet Take 2-4 mg by mouth 3 (three) times daily as needed.     TURMERIC PO Take by mouth.      UNABLE TO FIND Ultimate eye support     valACYclovir (VALTREX) 500 MG tablet TAKE ONE TABLET BY MOUTH DAILY FOR SUPPRESSIVE THERAPY AND INCREASE TO TWO TIMES A DAY FOR THREE DAYS WITH ANY NEW SYMPTOMS 90 tablet 1   VITAMIN D PO Take 4,000 Int'l Units by mouth daily.     No facility-administered medications prior to visit.    ROS    Objective:  Physical Exam   There were no vitals filed for this visit.  ***  CBC    Component Value Date/Time   WBC 7.3 04/15/2008 1805   RBC 4.25 04/15/2008 1805   HGB 13.8 05/31/2015 1052   HCT 39.0 04/15/2008 1805   PLT 322 04/15/2008 1805   MCV 91.9 04/15/2008 1805   MCHC 34.1 04/15/2008 1805   RDW 13.2 04/15/2008 1805   LYMPHSABS 2.8 04/15/2008 1805   MONOABS 0.5 04/15/2008 1805   EOSABS 0.1 04/15/2008 1805   BASOSABS 0.0 04/15/2008 1805     Chest imaging:  PFT:  Labs: December 20, 2021 SARS-CoV-2 positive  Path:  Echo: 2021 echocardiogram LVEF 60 to 01%, grade 1 diastolic dysfunction, normal RV size and function, valves are okay  Heart Catheterization:       Assessment & Plan:   No diagnosis found.  Discussion: ***  Immunizations: Immunization History  Administered Date(s) Administered   Fluad Quad(high Dose 65+) 12/03/2018   Influenza-Unspecified 11/18/2014, 12/15/2016   PFIZER(Purple Top)SARS-COV-2 Vaccination 05/14/2019, 06/09/2019   Td 03/19/1985, 03/20/1995   Tdap 04/25/2009   Zoster, Live 11/17/2013     Current Outpatient Medications:    ALPRAZolam (XANAX) 0.5 MG tablet, Take 0.25-0.5 mg by mouth every 8 (eight) hours as needed., Disp: , Rfl:    AMBULATORY NON FORMULARY MEDICATION, Medication  Name: immune factors twice daily, Disp: , Rfl:    ascorbic acid (VITAMIN C) 500 MG tablet, Take by mouth daily., Disp: , Rfl:    ASPIRIN  81 PO, Take by mouth., Disp: , Rfl:    benzonatate (TESSALON) 100 MG capsule, benzonatate 100 mg capsule  TAKE 1 (ONE) CAPSULE BY MOUTH FOUR TIMES DAILY, AS NEEDED, Disp: , Rfl:    Cranberry 400 MG CAPS, Take by mouth daily., Disp: , Rfl:    glucosamine-chondroitin 500-400 MG tablet, Take 1 tablet by mouth daily., Disp: , Rfl:    hydrochlorothiazide (HYDRODIURIL) 25 MG tablet, TAKE 1 TABLET BY MOUTH EVERY DAY IN THE MORNING, Disp: , Rfl: 1   ibuprofen (ADVIL,MOTRIN) 400 MG tablet, Take 400 mg by mouth every 6 (six) hours as needed., Disp: , Rfl:    metoprolol succinate (TOPROL-XL) 50 MG 24 hr tablet, Take 50 mg by mouth daily., Disp: , Rfl:    molnupiravir EUA (LAGEVRIO) 200 mg CAPS capsule, Take 4 capsules (800 mg total) by mouth 2 (two) times daily for 5 days., Disp: 40 capsule, Rfl: 0   Multiple Vitamin (MULTIVITAMIN) tablet, Take 1 tablet by mouth daily., Disp: , Rfl:    mupirocin ointment (BACTROBAN) 2 %, , Disp: , Rfl:    nystatin (MYCOSTATIN) 100000 UNIT/ML suspension, 5 ml swish QID and swallow x 2 weeks, Disp: 300 mL, Rfl: 0   pantoprazole (PROTONIX) 40 MG tablet, TAKE ONE TABLET BY MOUTH DAILY BEFORE BREAKFAST, Disp: 90 tablet, Rfl: 1   tiZANidine (ZANAFLEX) 2 MG tablet, 1/4-1 tablet up to 4 times a day, Disp: , Rfl:    tiZANidine (ZANAFLEX) 4 MG tablet, Take 2-4 mg by mouth 3 (three) times daily as needed., Disp: , Rfl:    TURMERIC PO, Take by mouth. , Disp: , Rfl:    UNABLE TO FIND, Ultimate eye support, Disp: , Rfl:    valACYclovir (VALTREX) 500 MG tablet, TAKE ONE TABLET BY MOUTH DAILY FOR SUPPRESSIVE THERAPY AND INCREASE TO TWO TIMES A DAY FOR THREE DAYS WITH ANY NEW SYMPTOMS, Disp: 90 tablet, Rfl: 1   VITAMIN D PO, Take 4,000 Int'l Units by mouth daily., Disp: , Rfl:

## 2021-12-29 ENCOUNTER — Institutional Professional Consult (permissible substitution): Payer: Medicare Other | Admitting: Pulmonary Disease

## 2022-01-17 ENCOUNTER — Ambulatory Visit: Payer: Medicare Other | Admitting: Pulmonary Disease

## 2022-01-17 ENCOUNTER — Encounter: Payer: Self-pay | Admitting: Pulmonary Disease

## 2022-01-17 VITALS — BP 130/86 | HR 73 | Ht 68.0 in | Wt 236.6 lb

## 2022-01-17 DIAGNOSIS — R058 Other specified cough: Secondary | ICD-10-CM | POA: Diagnosis not present

## 2022-01-17 DIAGNOSIS — J40 Bronchitis, not specified as acute or chronic: Secondary | ICD-10-CM

## 2022-01-17 DIAGNOSIS — K222 Esophageal obstruction: Secondary | ICD-10-CM

## 2022-01-17 DIAGNOSIS — K219 Gastro-esophageal reflux disease without esophagitis: Secondary | ICD-10-CM

## 2022-01-17 NOTE — Progress Notes (Signed)
Synopsis: Referred in October 2023 for morning mucus production  Subjective:   PATIENT ID: Alexandria Mercado GENDER: female DOB: June 05, 1955, MRN: [id number]   HPI  Chief Complaint  Patient presents with   Consult    [name] says taht she has a continuous feeling that she needs to clear her throat and feels like there is something in her throat that won't go "up or down".  She uses albuterol in the mornings often.  She feels like she always needs to clear her throat.  It doesn't bother her in the daytime, but at night it always happens.  She says that she'll wake up with a "whistle"  and taking water and guaifenesin will clear it up.  She says it started when she had pneumonia a year ago.  She was not admitted but she took antibiotics and a prescription cough medicine at home. Never hospitalized or needed oxygen. She had a fever for a few days.    She has never had a lung problem.  She previously smoked cigarettes, 1-1.5 ppd from teenage years for more than 25 years, quit in 2002.    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   Childhood was normal without respiratory problems.  She has no sinus symptoms, no post nasal drip.  She used to have heartburn in the past and has been on protonix for "a while".  She has had an endoscopy to evaluate.  She was put on protonix at that point.  She saw Dr. Carlean Purl.   Record from in December 20, 2021 urgent care visit reviewed where she was noted to have [facility] records from earlier this year reviewed.  She was seen by Dr. Percival Spanish for hypertension management.  Notes indicate she had a negative echocardiogram in 2021 and had a negative Myoview scan at some point in the last few years  Past Medical History:  Diagnosis Date   Anxiety    Chronic RLQ pain 1989   secondary to adhesions   Depression    grief counseling   Diverticulosis of colon (without mention of hemorrhage)    Esophageal obstruction    GERD with  stricture    Hx of adenomatous colonic polyps 10/07/2015   Hyperlipidemia    NO MEDS   Hypertension    Osteopenia    Pneumonia    PONV (postoperative nausea and vomiting)    Premature menopause age 19   On HRT age 81 - 2007   Smoker    Trigger thumb of right hand 09/2016   Vitamin D deficiency      Family History  Problem Relation Age of Onset   Colon cancer Maternal Uncle 34   Hyperlipidemia Mother    Heart failure Mother    Hypertension Brother    Heart failure Maternal Grandfather    Colon cancer Other    Esophageal cancer Neg Hx    Rectal cancer Neg Hx    Stomach cancer Neg Hx      Social History   Socioeconomic History   Marital status: Married    Spouse name: Not on file   Number of children: Not on file   Years of education: Not on file   Highest education level: Not on file  Occupational History   Not on file  Tobacco Use   Smoking status: Former    Types: Cigarettes    Quit date: 03/19/2000    Years since quitting: 21.8   Smokeless tobacco: Never  Vaping Use   Vaping Use: Never used  Substance and Sexual Activity   Alcohol use: No    Alcohol/week: 0.0 standard drinks of alcohol   Drug use: No   Sexual activity: Not Currently    Partners: Male    Birth control/protection: Post-menopausal  Other Topics Concern   Not on file  Social History Narrative   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     Social Determinants of Health   Financial Resource Strain: Not on file  Food Insecurity: Not on file  Transportation Needs: Not on file  Physical Activity: Not on file  Stress: Not on file  Social Connections: Not on file  Intimate Partner Violence: Not on file     Allergies  Allergen Reactions   Amoxicillin-Pot Clavulanate     REACTION: rash   Ceclor [Cefaclor]     REACTION: rash   Codeine     REACTION: nausea   Doxycycline     Slight itching   Keflex [Cephalexin]     itching     Outpatient Medications Prior to Visit  Medication  Sig Dispense Refill   albuterol (VENTOLIN HFA) 108 (90 Base) MCG/ACT inhaler Inhale 1 puff into the lungs every 6 (six) hours as needed for wheezing or shortness of breath.     ALPRAZolam (XANAX) 0.5 MG tablet Take 0.25-0.5 mg by mouth every 8 (eight) hours as needed.     AMBULATORY NON FORMULARY MEDICATION Medication Name: immune factors twice daily     ascorbic acid (VITAMIN C) 500 MG tablet Take by mouth daily.     ASPIRIN 81 PO Take by mouth.     benzonatate (TESSALON) 100 MG capsule benzonatate 100 mg capsule  TAKE 1 (ONE) CAPSULE BY MOUTH FOUR TIMES DAILY, AS NEEDED     Cranberry 400 MG CAPS Take by mouth daily.     glucosamine-chondroitin 500-400 MG tablet Take 1 tablet by mouth daily.     hydrochlorothiazide (HYDRODIURIL) 25 MG tablet TAKE 1 TABLET BY MOUTH EVERY DAY IN THE MORNING  1   ibuprofen (ADVIL,MOTRIN) 400 MG tablet Take 400 mg by mouth every 6 (six) hours as needed.     metoprolol succinate (TOPROL-XL) 50 MG 24 hr tablet Take 50 mg by mouth daily.     Multiple Vitamin (MULTIVITAMIN) tablet Take 1 tablet by mouth daily.     pantoprazole (PROTONIX) 40 MG tablet TAKE ONE TABLET BY MOUTH DAILY BEFORE BREAKFAST 90 tablet 1   tiZANidine (ZANAFLEX) 2 MG tablet 1/4-1 tablet up to 4 times a day     tiZANidine (ZANAFLEX) 4 MG tablet Take 2-4 mg by mouth 3 (three) times daily as needed.     TURMERIC PO Take by mouth.      UNABLE TO FIND Ultimate eye support     VITAMIN D PO Take 4,000 Int'l Units by mouth daily.     mupirocin ointment (BACTROBAN) 2 %  (Patient not taking: Reported on 01/17/2022)     nystatin (MYCOSTATIN) 100000 UNIT/ML suspension 5 ml swish QID and swallow x 2 weeks (Patient not taking: Reported on 01/17/2022) 300 mL 0   valACYclovir (VALTREX) 500 MG tablet TAKE ONE TABLET BY MOUTH DAILY FOR SUPPRESSIVE THERAPY AND INCREASE TO TWO TIMES A DAY FOR THREE DAYS WITH ANY NEW SYMPTOMS (Patient not taking: Reported on 01/17/2022) 90 tablet 1   No facility-administered  medications prior to visit.    Review of Systems  Constitutional:  Negative for chills, fever, malaise/fatigue and weight  loss.  HENT:  Negative for congestion, nosebleeds, sinus pain and sore throat.   Eyes:  Negative for photophobia, pain and discharge.  Respiratory:  Positive for cough. Negative for hemoptysis, sputum production, shortness of breath and wheezing.   Cardiovascular:  Negative for chest pain, palpitations, orthopnea and leg swelling.  Gastrointestinal:  Negative for abdominal pain, constipation, diarrhea, nausea and vomiting.  Genitourinary:  Negative for dysuria, frequency, hematuria and urgency.  Musculoskeletal:  Negative for back pain, joint pain, myalgias and neck pain.  Skin:  Negative for itching and rash.  Neurological:  Negative for tingling, tremors, sensory change, speech change, focal weakness, seizures, weakness and headaches.  Psychiatric/Behavioral:  Negative for memory loss, substance abuse and suicidal ideas. The patient is not nervous/anxious.       Objective:  Physical Exam   Vitals:   01/17/22 1359  BP: 130/86  Pulse: 73  SpO2: 100%  Weight: 236 lb 9.6 oz (107.3 kg)  Height: '5\' 8"'$  (1.727 m)    Gen: well appearing, no acute distress HENT: NCAT, OP clear, neck supple without masses Eyes: PERRL, EOMi Lymph: no cervical lymphadenopathy PULM: CTA B CV: RRR, no mgr, no JVD GI: BS+, soft, nontender, no hsm Derm: no rash or skin breakdown MSK: normal bulk and tone Neuro: A&Ox4, MAEW Psyche: normal mood and affect   CBC    Component Value Date/Time   WBC 7.3 04/15/2008 1805   RBC 4.25 04/15/2008 1805   HGB 13.8 05/31/2015 1052   HCT 39.0 04/15/2008 1805   PLT 322 04/15/2008 1805   MCV 91.9 04/15/2008 1805   MCHC 34.1 04/15/2008 1805   RDW 13.2 04/15/2008 1805   LYMPHSABS 2.8 04/15/2008 1805   MONOABS 0.5 04/15/2008 1805   EOSABS 0.1 04/15/2008 1805   BASOSABS 0.0 04/15/2008 1805     Chest imaging:  PFT: November 2023  spirometry:  ratio 77%, FEV1 2.24 L 82% predicted  Labs: December 20, 2021 SARS-CoV-2 positive  Path:  Echo: 2021 echocardiogram LVEF 60 to 53%, grade 1 diastolic dysfunction, normal RV size and function, valves are okay  Heart Catheterization:       Assessment & Plan:   Bronchitis - Plan: Spirometry with graph  GERD with stricture  Upper airway cough syndrome  Discussion: Seini describes a globus sensation in her throat without other respiratory complaints.  This sometimes causes her to cough.  She feels that there is mucus around her vocal cords but not necessarily in her chest.  No shortness of breath, wheezing.  Globus sensation in throat: Cough due to GERD I believe this is most likely due to gastroesophageal reflux disease Avoid fatty foods, alcohol, chocolate, caffeine and tobacco products Sleep with your head/upper torso elevated No eating within 3 hours of bedtime Keep taking protonix as you are doing If no improvement in the cough after raising the head of your bed then call me and I will refer you to ENT for further evaluation.  We will see you back if no improvement or if your symptoms worsen  Immunizations: Immunization History  Administered Date(s) Administered   Fluad Quad(high Dose 65+) 12/03/2018   Influenza-Unspecified 11/18/2014, 12/15/2016   PFIZER(Purple Top)SARS-COV-2 Vaccination 05/14/2019, 06/09/2019   Td 03/19/1985, 03/20/1995   Tdap 04/25/2009   Zoster, Live 11/17/2013     Current Outpatient Medications:    albuterol (VENTOLIN HFA) 108 (90 Base) MCG/ACT inhaler, Inhale 1 puff into the lungs every 6 (six) hours as needed for wheezing or shortness of breath., Disp: , Rfl:  ALPRAZolam (XANAX) 0.5 MG tablet, Take 0.25-0.5 mg by mouth every 8 (eight) hours as needed., Disp: , Rfl:    AMBULATORY NON FORMULARY MEDICATION, Medication Name: immune factors twice daily, Disp: , Rfl:    ascorbic acid (VITAMIN C) 500 MG tablet, Take by mouth daily.,  Disp: , Rfl:    ASPIRIN 81 PO, Take by mouth., Disp: , Rfl:    benzonatate (TESSALON) 100 MG capsule, benzonatate 100 mg capsule  TAKE 1 (ONE) CAPSULE BY MOUTH FOUR TIMES DAILY, AS NEEDED, Disp: , Rfl:    Cranberry 400 MG CAPS, Take by mouth daily., Disp: , Rfl:    glucosamine-chondroitin 500-400 MG tablet, Take 1 tablet by mouth daily., Disp: , Rfl:    hydrochlorothiazide (HYDRODIURIL) 25 MG tablet, TAKE 1 TABLET BY MOUTH EVERY DAY IN THE MORNING, Disp: , Rfl: 1   ibuprofen (ADVIL,MOTRIN) 400 MG tablet, Take 400 mg by mouth every 6 (six) hours as needed., Disp: , Rfl:    metoprolol succinate (TOPROL-XL) 50 MG 24 hr tablet, Take 50 mg by mouth daily., Disp: , Rfl:    Multiple Vitamin (MULTIVITAMIN) tablet, Take 1 tablet by mouth daily., Disp: , Rfl:    pantoprazole (PROTONIX) 40 MG tablet, TAKE ONE TABLET BY MOUTH DAILY BEFORE BREAKFAST, Disp: 90 tablet, Rfl: 1   tiZANidine (ZANAFLEX) 2 MG tablet, 1/4-1 tablet up to 4 times a day, Disp: , Rfl:    tiZANidine (ZANAFLEX) 4 MG tablet, Take 2-4 mg by mouth 3 (three) times daily as needed., Disp: , Rfl:    TURMERIC PO, Take by mouth. , Disp: , Rfl:    UNABLE TO FIND, Ultimate eye support, Disp: , Rfl:    VITAMIN D PO, Take 4,000 Int'l Units by mouth daily., Disp: , Rfl:    mupirocin ointment (BACTROBAN) 2 %, , Disp: , Rfl:    nystatin (MYCOSTATIN) 100000 UNIT/ML suspension, 5 ml swish QID and swallow x 2 weeks (Patient not taking: Reported on 01/17/2022), Disp: 300 mL, Rfl: 0   valACYclovir (VALTREX) 500 MG tablet, TAKE ONE TABLET BY MOUTH DAILY FOR SUPPRESSIVE THERAPY AND INCREASE TO TWO TIMES A DAY FOR THREE DAYS WITH ANY NEW SYMPTOMS (Patient not taking: Reported on 01/17/2022), Disp: 90 tablet, Rfl: 1

## 2022-01-17 NOTE — Patient Instructions (Signed)
Globus sensation in throat: Cough due to GERD I believe this is most likely due to gastroesophageal reflux disease Avoid fatty foods, alcohol, chocolate, caffeine and tobacco products Sleep with your head/upper torso elevated No eating within 3 hours of bedtime Keep taking protonix as you are doing If no improvement in the cough after raising the head of your bed then call me and I will refer you to ENT for further evaluation.  We will see you back if no improvement or if your symptoms worsen

## 2022-02-26 ENCOUNTER — Other Ambulatory Visit: Payer: Self-pay | Admitting: Nurse Practitioner

## 2022-02-26 ENCOUNTER — Encounter: Payer: Self-pay | Admitting: Internal Medicine

## 2022-02-26 ENCOUNTER — Encounter: Payer: Self-pay | Admitting: Nurse Practitioner

## 2022-02-26 DIAGNOSIS — Z1231 Encounter for screening mammogram for malignant neoplasm of breast: Secondary | ICD-10-CM

## 2022-03-19 HISTORY — PX: COLONOSCOPY: SHX174

## 2022-03-29 ENCOUNTER — Ambulatory Visit (AMBULATORY_SURGERY_CENTER): Payer: Medicare Other

## 2022-03-29 VITALS — Ht 68.0 in | Wt 236.0 lb

## 2022-03-29 DIAGNOSIS — Z86010 Personal history of colonic polyps: Secondary | ICD-10-CM

## 2022-03-29 NOTE — Progress Notes (Addendum)
No egg or soy allergy known to patient  No issues known to pt with past sedation with any surgeries or procedures Patient denies ever being told they had issues or difficulty with intubation  No FH of Malignant Hyperthermia Pt is not on diet pills Pt is not on  home 02  Pt is not on blood thinners  No A fib or A flutter Have any cardiac testing pending--no Pt instructed to use [email or web address] or GoodRx for a price reduction on prep

## 2022-04-11 ENCOUNTER — Encounter: Payer: Self-pay | Admitting: Internal Medicine

## 2022-04-12 ENCOUNTER — Telehealth: Payer: Self-pay | Admitting: Physician Assistant

## 2022-04-12 NOTE — Telephone Encounter (Signed)
No issues with constipation was removed from chart.

## 2022-04-12 NOTE — Telephone Encounter (Signed)
Patient called said she noticed in her pre visit office notes it indicated that she denied having issues with constipation. She said she specifically mentioned to the nurse that she has chronic constipation. Asked that it be corrected in her chart.

## 2022-04-18 ENCOUNTER — Ambulatory Visit (AMBULATORY_SURGERY_CENTER): Payer: Medicare Other | Admitting: Internal Medicine

## 2022-04-18 ENCOUNTER — Encounter: Payer: Self-pay | Admitting: Internal Medicine

## 2022-04-18 VITALS — BP 110/56 | HR 55 | Temp 96.8°F | Resp 15 | Ht 68.0 in | Wt 236.0 lb

## 2022-04-18 DIAGNOSIS — Z86010 Personal history of colonic polyps: Secondary | ICD-10-CM | POA: Diagnosis not present

## 2022-04-18 DIAGNOSIS — Z09 Encounter for follow-up examination after completed treatment for conditions other than malignant neoplasm: Secondary | ICD-10-CM | POA: Diagnosis present

## 2022-04-18 MED ORDER — SODIUM CHLORIDE 0.9 % IV SOLN: 500.0000 mL | Freq: Once | INTRAVENOUS | Status: DC

## 2022-04-18 NOTE — Patient Instructions (Addendum)
No polyps today so your next routine colonoscopy should be in 5 years - 2029.  I appreciate the opportunity to care for you. Gatha Mayer, MD, FACG   YOU HAD AN ENDOSCOPIC PROCEDURE TODAY AT [facility]:   Refer to the procedure report that was given to you for any specific questions about what was found during the examination.  If the procedure report does not answer your questions, please call your gastroenterologist to clarify.  If you requested that your care partner not be given the details of your procedure findings, then the procedure report has been included in a sealed envelope for you to review at your convenience later.  YOU SHOULD EXPECT: Some feelings of bloating in the abdomen. Passage of more gas than usual.  Walking can help get rid of the air that was put into your GI tract during the procedure and reduce the bloating. If you had a lower endoscopy (such as a colonoscopy or flexible sigmoidoscopy) you may notice spotting of blood in your stool or on the toilet paper. If you underwent a bowel prep for your procedure, you may not have a normal bowel movement for a few days.  Please Note:  You might notice some irritation and congestion in your nose or some drainage.  This is from the oxygen used during your procedure.  There is no need for concern and it should clear up in a day or so.  SYMPTOMS TO REPORT IMMEDIATELY:  Following lower endoscopy (colonoscopy or flexible sigmoidoscopy):  Excessive amounts of blood in the stool  Significant tenderness or worsening of abdominal pains  Swelling of the abdomen that is new, acute  Fever of 100F or higher   For urgent or emergent issues, a gastroenterologist can be reached at any hour by calling [phone]. Do not use MyChart messaging for urgent concerns.    DIET:  We do recommend a small meal at first, but then you may proceed to your regular diet.  Drink plenty of fluids but you should avoid alcoholic  beverages for 24 hours.  ACTIVITY:  You should plan to take it easy for the rest of today and you should NOT DRIVE or use heavy machinery until tomorrow (because of the sedation medicines used during the test).    FOLLOW UP: Our staff will call the number listed on your records the next business day following your procedure.  We will call around 7:15- 8:00 am to check on you and address any questions or concerns that you may have regarding the information given to you following your procedure. If we do not reach you, we will leave a message.     If any biopsies were taken you will be contacted by phone or by letter within the next 1-3 weeks.  Please call us at [phone] if you have not heard about the biopsies in 3 weeks.    SIGNATURES/CONFIDENTIALITY: You and/or your care partner have signed paperwork which will be entered into your electronic medical record.  These signatures attest to the fact that that the information above on your After Visit Summary has been reviewed and is understood.  Full responsibility of the confidentiality of this discharge information lies with you and/or your care-partner.

## 2022-04-18 NOTE — Progress Notes (Signed)
A/ox3, pleased with MAC, report to RN 

## 2022-04-18 NOTE — Progress Notes (Signed)
[facility] History and Physical   Primary Care Physician:  Simona Huh, NP   Reason for Procedure:   Hx colon polyps  Plan:    colonoscopy     HPI: Alexandria Mercado is a 67 y.o. female w/ prior colon polyps  09/2015 4 diminutive adenomas 03/31/2019 5 diminutive polyps adenomas  Past Medical History:  Diagnosis Date   Anxiety    Arthritis    Chronic RLQ pain 1989   secondary to adhesions   Depression    grief counseling   Diverticulosis of colon (without mention of hemorrhage)    Esophageal obstruction    GERD with stricture    Hx of adenomatous colonic polyps 10/07/2015   Hyperlipidemia    NO MEDS   Hypertension    Osteopenia    Pneumonia    PONV (postoperative nausea and vomiting)    Premature menopause age 11   On HRT age 55 - 2007   Smoker    Trigger thumb of right hand 09/2016   Vitamin D deficiency     Past Surgical History:  Procedure Laterality Date   BREAST EXCISIONAL BIOPSY Right 06/11/2006   BUNIONECTOMY Right    CHEILECTOMY Right 9/06   great toe    COLONOSCOPY  1988   normal   COLONOSCOPY  9/98   normal   COLONOSCOPY  11/03   normal   COLONOSCOPY W/ BIOPSIES  03/2008   mild diverticula - recheck 7 years   ESOPHAGOGASTRODUODENOSCOPY N/A 11/08/2016   Procedure: ESOPHAGOGASTRODUODENOSCOPY (EGD);  Surgeon: Gatha Mayer, MD;  Location: [facility];  Service: Endoscopy;  Laterality: N/A;   HYSTEROSCOPY  2003   LAPAROSCOPIC OVARIAN CYSTECTOMY Right 1985   MM BREAST STEREO BX*L*R/S Right 06/2006    bloody nipple discharge - path reveals intraductal papilloma Dr. Arlyce Dice FINGER RELEASE Right 09/18/2016   Procedure: RELEASE TRIGGER FINGER/A-1 PULLEY RIGHT THUMB;  Surgeon: Daryll Brod, MD;  Location: [facility];  Service: Orthopedics;  Laterality: Right;  REG/FAB   UPPER GASTROINTESTINAL ENDOSCOPY      Prior to Admission medications   Medication Sig Start Date End Date Taking? Authorizing Provider  albuterol  (VENTOLIN HFA) 108 (90 Base) MCG/ACT inhaler Inhale 1 puff into the lungs every 6 (six) hours as needed for wheezing or shortness of breath.   Yes [provider]  ALPRAZolam (XANAX) 0.5 MG tablet Take 0.25-0.5 mg by mouth every 8 (eight) hours as needed. 01/05/19  Yes [provider]  ascorbic acid (VITAMIN C) 500 MG tablet Take by mouth daily.   Yes [provider]  Cranberry 400 MG CAPS Take by mouth daily.   Yes [provider]  glucosamine-chondroitin 500-400 MG tablet Take 1 tablet by mouth daily.   Yes [provider]  hydrochlorothiazide (HYDRODIURIL) 25 MG tablet TAKE 1 TABLET BY MOUTH EVERY DAY IN THE MORNING 11/22/16  Yes [provider]  metoprolol succinate (TOPROL-XL) 50 MG 24 hr tablet Take 50 mg by mouth daily. 07/28/20  Yes [provider]  Multiple Vitamin (MULTIVITAMIN) tablet Take 1 tablet by mouth daily.   Yes [provider]  pantoprazole (PROTONIX) 40 MG tablet TAKE ONE TABLET BY MOUTH DAILY BEFORE BREAKFAST 11/29/21  Yes Gatha Mayer, MD  TURMERIC PO Take by mouth.    Yes [provider]  VITAMIN D PO Take 4,000 Int'l Units by mouth daily.   Yes [provider]  AMBULATORY NON FORMULARY MEDICATION Medication Name: immune factors twice daily Patient  not taking: Reported on 03/29/2022    [provider]  ASPIRIN 81 PO Take by mouth.    [provider]  benzonatate (TESSALON) 100 MG capsule benzonatate 100 mg capsule  TAKE 1 (ONE) CAPSULE BY MOUTH FOUR TIMES DAILY, AS NEEDED    [provider]  ibuprofen (ADVIL,MOTRIN) 400 MG tablet Take 400 mg by mouth every 6 (six) hours as needed.    [provider]  tiZANidine (ZANAFLEX) 2 MG tablet 1/4-1 tablet up to 4 times a day 03/14/21   [provider]  tiZANidine (ZANAFLEX) 4 MG tablet Take 2-4 mg by mouth 3 (three) times daily as needed. 05/16/21   [provider]  UNABLE TO FIND Ultimate eye  support Patient not taking: Reported on 03/29/2022    [provider]  valACYclovir (VALTREX) 500 MG tablet TAKE ONE TABLET BY MOUTH DAILY FOR SUPPRESSIVE THERAPY AND INCREASE TO TWO TIMES A DAY FOR THREE DAYS WITH ANY NEW SYMPTOMS 10/13/21   Tamela Gammon, NP    Current Outpatient Medications  Medication Sig Dispense Refill   albuterol (VENTOLIN HFA) 108 (90 Base) MCG/ACT inhaler Inhale 1 puff into the lungs every 6 (six) hours as needed for wheezing or shortness of breath.     ALPRAZolam (XANAX) 0.5 MG tablet Take 0.25-0.5 mg by mouth every 8 (eight) hours as needed.     ascorbic acid (VITAMIN C) 500 MG tablet Take by mouth daily.     Cranberry 400 MG CAPS Take by mouth daily.     glucosamine-chondroitin 500-400 MG tablet Take 1 tablet by mouth daily.     hydrochlorothiazide (HYDRODIURIL) 25 MG tablet TAKE 1 TABLET BY MOUTH EVERY DAY IN THE MORNING  1   metoprolol succinate (TOPROL-XL) 50 MG 24 hr tablet Take 50 mg by mouth daily.     Multiple Vitamin (MULTIVITAMIN) tablet Take 1 tablet by mouth daily.     pantoprazole (PROTONIX) 40 MG tablet TAKE ONE TABLET BY MOUTH DAILY BEFORE BREAKFAST 90 tablet 1   TURMERIC PO Take by mouth.      VITAMIN D PO Take 4,000 Int'l Units by mouth daily.     AMBULATORY NON FORMULARY MEDICATION Medication Name: immune factors twice daily (Patient not taking: Reported on 03/29/2022)     ASPIRIN 81 PO Take by mouth.     benzonatate (TESSALON) 100 MG capsule benzonatate 100 mg capsule  TAKE 1 (ONE) CAPSULE BY MOUTH FOUR TIMES DAILY, AS NEEDED     ibuprofen (ADVIL,MOTRIN) 400 MG tablet Take 400 mg by mouth every 6 (six) hours as needed.     tiZANidine (ZANAFLEX) 2 MG tablet 1/4-1 tablet up to 4 times a day     tiZANidine (ZANAFLEX) 4 MG tablet Take 2-4 mg by mouth 3 (three) times daily as needed.     UNABLE TO FIND Ultimate eye support (Patient not taking: Reported on 03/29/2022)     valACYclovir (VALTREX) 500 MG tablet TAKE ONE TABLET BY MOUTH DAILY  FOR SUPPRESSIVE THERAPY AND INCREASE TO TWO TIMES A DAY FOR THREE DAYS WITH ANY NEW SYMPTOMS 90 tablet 1   Current Facility-Administered Medications  Medication Dose Route Frequency Provider Last Rate Last Admin   0.9 %  sodium chloride infusion  500 mL Intravenous Once Gatha Mayer, MD        Allergies as of 04/18/2022 - Review Complete 04/18/2022  Allergen Reaction Noted   Amoxicillin-pot clavulanate  03/09/2008   Ceclor [cefaclor]  03/09/2008   Codeine  03/09/2008  Doxycycline  02/26/2018   Keflex [cephalexin]  05/26/2014    Family History  Problem Relation Age of Onset   Hyperlipidemia Mother    Heart failure Mother    Hypertension Brother    Colon polyps Maternal Uncle    Colon cancer Maternal Uncle 13   Heart failure Maternal Grandfather    Colon polyps Other    Colon cancer Other    Esophageal cancer Neg Hx    Rectal cancer Neg Hx    Stomach cancer Neg Hx     Social History   Socioeconomic History   Marital status: Married    Spouse name: Not on file   Number of children: Not on file   Years of education: Not on file   Highest education level: Not on file  Occupational History   Not on file  Tobacco Use   Smoking status: Former    Types: Cigarettes    Quit date: 03/19/2000    Years since quitting: 22.0   Smokeless tobacco: Never  Vaping Use   Vaping Use: Never used  Substance and Sexual Activity   Alcohol use: No    Alcohol/week: 0.0 standard drinks of alcohol   Drug use: No   Sexual activity: Not Currently    Partners: Male    Birth control/protection: Post-menopausal  Other Topics Concern   Not on file  Social History Narrative   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     Social Determinants of Health   Financial Resource Strain: Not on file  Food Insecurity: Not on file  Transportation Needs: Not on file  Physical Activity: Not on file  Stress: Not on file  Social Connections: Not on file  Intimate Partner Violence: Not on file     Review of Systems:  All other review of systems negative except as mentioned in the HPI.  Physical Exam: Vital signs BP 107/69   Pulse 70   Temp (!) 96.8 F (36 C) (Temporal)   Ht '5\' 8"'$  (1.727 m)   Wt 236 lb (107 kg)   LMP 09/17/1990 (Approximate)   SpO2 98%   BMI 35.88 kg/m   General:   Alert,  Well-developed, well-nourished, pleasant and cooperative in NAD Lungs:  Clear throughout to auscultation.   Heart:  Regular rate and rhythm; no murmurs, clicks, rubs,  or gallops. Abdomen:  Soft,  mildly tender LLQ and nondistended. Normal bowel sounds.  Small soft umbilical hernia Neuro/Psych:  Alert and cooperative. Normal mood and affect. A and O x 3   '@Aquil Duhe'$  Simonne Maffucci, MD, [facility] [phone] (pager) 04/18/2022 7:52 AM@

## 2022-04-18 NOTE — Op Note (Signed)
[facility] Patient Name: Alexandria Mercado Procedure Date: 04/18/2022 7:08 AM MRN: [id number] Endoscopist: Gatha Mayer , MD, [id number] Age: 67 Referring MD:  Date of Birth: 10-08-1955 Gender: Female Account #: [id number] Procedure:                Colonoscopy Indications:              Surveillance: Personal history of adenomatous                            polyps on last colonoscopy 3 years ago, Last                            colonoscopy: January 2021 Medicines:                Monitored Anesthesia Care Procedure:                Pre-Anesthesia Assessment:                           - Prior to the procedure, a History and Physical                            was performed, and patient medications and                            allergies were reviewed. The patient's tolerance of                            previous anesthesia was also reviewed. The risks                            and benefits of the procedure and the sedation                            options and risks were discussed with the patient.                            All questions were answered, and informed consent                            was obtained. Prior Anticoagulants: The patient has                            taken no anticoagulant or antiplatelet agents. ASA                            Grade Assessment: II - A patient with mild systemic                            disease. After reviewing the risks and benefits,                            the patient was deemed in satisfactory condition to  undergo the procedure.                           After obtaining informed consent, the colonoscope                            was passed under direct vision. Throughout the                            procedure, the patient's blood pressure, pulse, and                            oxygen saturations were monitored continuously. The                            PCF-HQ190L Colonoscope was introduced  through the                            anus and advanced to the the cecum, identified by                            appendiceal orifice and ileocecal valve. The                            colonoscopy was performed without difficulty. The                            patient tolerated the procedure well. The quality                            of the bowel preparation was good. The ileocecal                            valve, appendiceal orifice, and rectum were                            photographed. The bowel preparation used was                            Miralax via split dose instruction. Scope In: 8:06:30 AM Scope Out: 8:24:54 AM Scope Withdrawal Time: 0 hours 13 minutes 2 seconds  Total Procedure Duration: 0 hours 18 minutes 24 seconds  Findings:                 The perianal and digital rectal examinations were                            normal.                           Scattered small-mouthed diverticula were found in                            the sigmoid colon and descending colon.  The exam was otherwise without abnormality on                            direct and retroflexion views. Complications:            No immediate complications. Estimated Blood Loss:     Estimated blood loss: none. Impression:               - Diverticulosis in the sigmoid colon and in the                            descending colon.                           - The examination was otherwise normal on direct                            and retroflexion views.                           - No specimens collected.                           - Personal history of colonic polyps. 09/2015 4                            diminutive adenomas                           03/31/2019 5 diminutive polyps adenomas Recommendation:           - Patient has a contact number available for                            emergencies. The signs and symptoms of potential                            delayed  complications were discussed with the                            patient. Return to normal activities tomorrow.                            Written discharge instructions were provided to the                            patient.                           - Resume previous diet.                           - Continue present medications.                           - Repeat colonoscopy in 5 years for surveillance. Gatha Mayer, MD 04/18/2022 8:32:57 AM This report has been signed electronically.

## 2022-04-18 NOTE — Progress Notes (Signed)
VS completed by DT.  Pt's states no medical or surgical changes since previsit or office visit.  

## 2022-04-19 ENCOUNTER — Telehealth: Payer: Self-pay

## 2022-04-19 NOTE — Telephone Encounter (Signed)
Attempted f/u call. No answer, left VM. 

## 2022-04-24 ENCOUNTER — Ambulatory Visit
Admission: RE | Admit: 2022-04-24 | Discharge: 2022-04-24 | Disposition: A | Discharge status: Home / Self Care | Payer: Medicare Other | Source: Ambulatory Visit | Attending: Nurse Practitioner | Admitting: Nurse Practitioner

## 2022-04-24 DIAGNOSIS — Z1231 Encounter for screening mammogram for malignant neoplasm of breast: Secondary | ICD-10-CM

## 2022-04-25 ENCOUNTER — Telehealth: Payer: Self-pay | Admitting: Internal Medicine

## 2022-04-25 NOTE — Telephone Encounter (Signed)
Inbound call from patient stating she would like to have a referral sent to a [facility]. Please advise.

## 2022-04-26 ENCOUNTER — Other Ambulatory Visit: Payer: Self-pay | Admitting: Nurse Practitioner

## 2022-04-26 DIAGNOSIS — R928 Other abnormal and inconclusive findings on diagnostic imaging of breast: Secondary | ICD-10-CM

## 2022-04-26 NOTE — Telephone Encounter (Signed)
Left message for pt to call back  °

## 2022-04-27 NOTE — Telephone Encounter (Signed)
Pt stated that she is requesting a referral for a [facility] to assist her with eating foods that produce less gas. Pt notified that typically these referrals are made through the PCP and recommended that she contact her PCP. Pt verbalized understanding with all questions answered.

## 2022-05-07 ENCOUNTER — Ambulatory Visit
Admission: RE | Admit: 2022-05-07 | Discharge: 2022-05-07 | Disposition: A | Discharge status: Home / Self Care | Payer: Medicare Other | Source: Ambulatory Visit | Attending: Nurse Practitioner | Admitting: Nurse Practitioner

## 2022-05-07 ENCOUNTER — Other Ambulatory Visit: Payer: Self-pay | Admitting: Nurse Practitioner

## 2022-05-07 DIAGNOSIS — N6320 Unspecified lump in the left breast, unspecified quadrant: Secondary | ICD-10-CM

## 2022-05-07 DIAGNOSIS — R928 Other abnormal and inconclusive findings on diagnostic imaging of breast: Secondary | ICD-10-CM

## 2022-05-15 ENCOUNTER — Ambulatory Visit
Admission: RE | Admit: 2022-05-15 | Discharge: 2022-05-15 | Disposition: A | Discharge status: Home / Self Care | Payer: Medicare Other | Source: Ambulatory Visit | Attending: Nurse Practitioner | Admitting: Nurse Practitioner

## 2022-05-15 DIAGNOSIS — N6320 Unspecified lump in the left breast, unspecified quadrant: Secondary | ICD-10-CM

## 2022-05-15 DIAGNOSIS — R928 Other abnormal and inconclusive findings on diagnostic imaging of breast: Secondary | ICD-10-CM

## 2022-05-15 HISTORY — PX: BREAST BIOPSY: SHX20

## 2022-05-21 ENCOUNTER — Other Ambulatory Visit: Payer: Self-pay | Admitting: Internal Medicine

## 2022-06-06 ENCOUNTER — Other Ambulatory Visit: Payer: Self-pay | Admitting: General Surgery

## 2022-06-06 DIAGNOSIS — N6092 Unspecified benign mammary dysplasia of left breast: Secondary | ICD-10-CM

## 2022-06-08 ENCOUNTER — Other Ambulatory Visit: Payer: Self-pay | Admitting: General Surgery

## 2022-06-08 DIAGNOSIS — N6092 Unspecified benign mammary dysplasia of left breast: Secondary | ICD-10-CM

## 2022-06-25 NOTE — Pre-Procedure Instructions (Signed)
Surgical Instructions    Your procedure is scheduled on July 03, 2022.  Report to [facility] Main Entrance "A" at 7:00 A.M., then check in with the Admitting office.  Call this number if you have problems the morning of surgery:  [phone]  If you have any questions prior to your surgery date call [phone]: Open Monday-Friday 8am-4pm If you experience any cold or flu symptoms such as cough, fever, chills, shortness of breath, etc. between now and your scheduled surgery, please notify us at the above number.     Remember:  Do not eat after midnight the night before your surgery  You may drink clear liquids until 6:00 AM the morning of your surgery.   Clear liquids allowed are: Water, Non-Citrus Juices (without pulp), Carbonated Beverages, Clear Tea, Black Coffee Only (NO MILK, CREAM OR POWDERED CREAMER of any kind), and Gatorade.  Patient Instructions  The night before surgery:  No food after midnight. ONLY clear liquids after midnight  The day of surgery (if you do NOT have diabetes):  Drink ONE (1) Pre-Surgery Clear Ensure by 6:00 AM the morning of surgery. Drink in one sitting. Do not sip.  This drink was given to you during your hospital  pre-op appointment visit.  Nothing else to drink after completing the  Pre-Surgery Clear Ensure.         If you have questions, please contact your surgeon's office.     Take these medicines the morning of surgery with A SIP OF WATER:  metoprolol succinate (TOPROL-XL)   pantoprazole (PROTONIX)   valACYclovir (VALTREX)     Take these medicines IF NEEDED:  acetaminophen (TYLENOL)   albuterol (VENTOLIN HFA) inhaler   ALPRAZolam (XANAX)   benzonatate (TESSALON)   cyclobenzaprine (FLEXERIL)   Polyethyl Glycol-Propyl Glycol (SYSTANE) eye drops  sodium chloride (OCEAN) nasal spray   tiZANidine (ZANAFLEX)    Follow your surgeon's instructions on when to stop Aspirin.  If no instructions were given by your surgeon then you will  need to call the office to get those instructions.     As of today, STOP taking any Aleve, Naproxen, Ibuprofen, Motrin, Advil, Goody's, BC's, all herbal medications, fish oil, and all vitamins.                     Do NOT Smoke (Tobacco/Vaping) for 24 hours prior to your procedure.  If you use a CPAP at night, you may bring your mask/headgear for your overnight stay.   Contacts, glasses, piercing's, hearing aid's, dentures or partials may not be worn into surgery, please bring cases for these belongings.    For patients admitted to the hospital, discharge time will be determined by your treatment team.   Patients discharged the day of surgery will not be allowed to drive home, and someone needs to stay with them for 24 hours.  SURGICAL WAITING ROOM VISITATION Patients having surgery or a procedure may have no more than 2 support people in the waiting area - these visitors may rotate.   Children under the age of 20 must have an adult with them who is not the patient. If the patient needs to stay at the hospital during part of their recovery, the visitor guidelines for inpatient rooms apply. Pre-op nurse will coordinate an appropriate time for 1 support person to accompany patient in pre-op.  This support person may not rotate.   Please refer to the [facility] website for the visitor guidelines for Inpatients (after your surgery is  over and you are in a regular room).    Special instructions:   [facility]- Preparing For Surgery  Before surgery, you can play an important role. Because skin is not sterile, your skin needs to be as free of germs as possible. You can reduce the number of germs on your skin by washing with CHG (chlorahexidine gluconate) Soap before surgery.  CHG is an antiseptic cleaner which kills germs and bonds with the skin to continue killing germs even after washing.    Oral Hygiene is also important to reduce your risk of infection.  Remember - BRUSH YOUR TEETH THE  MORNING OF SURGERY WITH YOUR REGULAR TOOTHPASTE  Please do not use if you have an allergy to CHG or antibacterial soaps. If your skin becomes reddened/irritated stop using the CHG.  Do not shave (including legs and underarms) for at least 48 hours prior to first CHG shower. It is OK to shave your face.  Please follow these instructions carefully.   Shower the NIGHT BEFORE SURGERY and the MORNING OF SURGERY  If you chose to wash your hair, wash your hair first as usual with your normal shampoo.  After you shampoo, rinse your hair and body thoroughly to remove the shampoo.  Use CHG Soap as you would any other liquid soap. You can apply CHG directly to the skin and wash gently with a scrungie or a clean washcloth.   Apply the CHG Soap to your body ONLY FROM THE NECK DOWN.  Do not use on open wounds or open sores. Avoid contact with your eyes, ears, mouth and genitals (private parts). Wash Face and genitals (private parts)  with your normal soap.   Wash thoroughly, paying special attention to the area where your surgery will be performed.  Thoroughly rinse your body with warm water from the neck down.  DO NOT shower/wash with your normal soap after using and rinsing off the CHG Soap.  Pat yourself dry with a CLEAN TOWEL.  Wear CLEAN PAJAMAS to bed the night before surgery  Place CLEAN SHEETS on your bed the night before your surgery  DO NOT SLEEP WITH PETS.   Day of Surgery: Take a shower with CHG soap. Do not wear jewelry or makeup Do not wear lotions, powders, perfumes/colognes, or deodorant. Do not shave 48 hours prior to surgery.  Men may shave face and neck. Do not bring valuables to the hospital.  [facility] is not responsible for any belongings or valuables. Do not wear nail polish, gel polish, artificial nails, or any other type of covering on natural nails (fingers and toes) If you have artificial nails or gel coating that need to be removed by a nail salon, please have  this removed prior to surgery. Artificial nails or gel coating may interfere with anesthesia's ability to adequately monitor your vital signs.  Wear Clean/Comfortable clothing the morning of surgery Remember to brush your teeth WITH YOUR REGULAR TOOTHPASTE.   Please read over the following fact sheets that you were given.    If you received a COVID test during your pre-op visit  it is requested that you wear a mask when out in public, stay away from anyone that may not be feeling well and notify your surgeon if you develop symptoms. If you have been in contact with anyone that has tested positive in the last 10 days please notify you surgeon.

## 2022-06-26 ENCOUNTER — Other Ambulatory Visit: Payer: Self-pay

## 2022-06-26 ENCOUNTER — Encounter (HOSPITAL_COMMUNITY)
Admission: RE | Admit: 2022-06-26 | Discharge: 2022-06-26 | Disposition: A | Discharge status: Home / Self Care | Payer: Medicare Other | Source: Ambulatory Visit | Attending: General Surgery | Admitting: General Surgery

## 2022-06-26 ENCOUNTER — Encounter (HOSPITAL_COMMUNITY): Payer: Self-pay

## 2022-06-26 VITALS — BP 136/78 | HR 85 | Temp 97.7°F | Resp 18 | Ht 68.0 in | Wt 240.3 lb

## 2022-06-26 DIAGNOSIS — K219 Gastro-esophageal reflux disease without esophagitis: Secondary | ICD-10-CM | POA: Insufficient documentation

## 2022-06-26 DIAGNOSIS — K222 Esophageal obstruction: Secondary | ICD-10-CM | POA: Diagnosis not present

## 2022-06-26 DIAGNOSIS — Z8719 Personal history of other diseases of the digestive system: Secondary | ICD-10-CM | POA: Insufficient documentation

## 2022-06-26 DIAGNOSIS — Z87891 Personal history of nicotine dependence: Secondary | ICD-10-CM | POA: Diagnosis not present

## 2022-06-26 DIAGNOSIS — N6320 Unspecified lump in the left breast, unspecified quadrant: Secondary | ICD-10-CM | POA: Diagnosis not present

## 2022-06-26 DIAGNOSIS — Z01818 Encounter for other preprocedural examination: Secondary | ICD-10-CM | POA: Diagnosis present

## 2022-06-26 DIAGNOSIS — R7303 Prediabetes: Secondary | ICD-10-CM | POA: Insufficient documentation

## 2022-06-26 DIAGNOSIS — I2510 Atherosclerotic heart disease of native coronary artery without angina pectoris: Secondary | ICD-10-CM | POA: Diagnosis not present

## 2022-06-26 DIAGNOSIS — E785 Hyperlipidemia, unspecified: Secondary | ICD-10-CM | POA: Insufficient documentation

## 2022-06-26 DIAGNOSIS — I10 Essential (primary) hypertension: Secondary | ICD-10-CM | POA: Diagnosis not present

## 2022-06-26 DIAGNOSIS — K5730 Diverticulosis of large intestine without perforation or abscess without bleeding: Secondary | ICD-10-CM | POA: Diagnosis not present

## 2022-06-26 HISTORY — DX: Prediabetes: R73.03

## 2022-06-26 HISTORY — DX: Umbilical hernia without obstruction or gangrene: K42.9

## 2022-06-26 LAB — BASIC METABOLIC PANEL
Anion gap: 9 (ref 5–15)
BUN: 15 mg/dL (ref 8–23)
CO2: 28 mmol/L (ref 22–32)
Calcium: 9.1 mg/dL (ref 8.9–10.3)
Chloride: 101 mmol/L (ref 98–111)
Creatinine, Ser: 0.84 mg/dL (ref 0.44–1.00)
GFR, Estimated: >60 mL/min (ref >60)
Glucose, Bld: 113 mg/dL — ABNORMAL HIGH (ref 70–99)
Potassium: 3.9 mmol/L (ref 3.5–5.1)
Sodium: 138 mmol/L (ref 135–145)

## 2022-06-26 LAB — CBC
HCT: 40.2 % (ref 36.0–46.0)
Hemoglobin: 13.4 g/dL (ref 12.0–15.0)
MCH: 30.7 pg (ref 26.0–34.0)
MCHC: 33.3 g/dL (ref 30.0–36.0)
MCV: 92 fL (ref 80.0–100.0)
Platelets: 339 10*3/uL (ref 150–400)
RBC: 4.37 MIL/uL (ref 3.87–5.11)
RDW: 13.1 % (ref 11.5–15.5)
WBC: 9.1 10*3/uL (ref 4.0–10.5)
nRBC: 0 % (ref 0.0–0.2)

## 2022-06-26 NOTE — Progress Notes (Addendum)
PCP - Courtney Paris, NP Cardiologist - Dr. Rollene Rotunda (saw for HTN. Last visit Nov. 2022. Now follows PRN)  PPM/ICD - Denies Device Orders - n/a Rep Notified - n/a  Chest x-ray - n/a EKG - 06/26/2022 Stress Test - 10/06/2020 ECHO - 05/05/2019 Cardiac Cath - Denies  Sleep Study - Denies CPAP - n/a  Per pt, she is Pre-Diabetic  Last dose of GLP1 agonist- n/a GLP1 instructions: n/a  Blood Thinner Instructions: n/a Aspirin Instructions: Last dose ASA 4/9. Pt will continue to hold medication until after surgery.  ERAS Protcol - Clear liquids until 0600 morning of surgery PRE-SURGERY Ensure or G2- Ensure given to pt with instructions  COVID TEST- n/a   Anesthesia review: Yes. Breast seed placement. Abnormal EKG (per pt, Dr. Antoine Poche aware of abnormal EKGs and not concerned)  Patient denies shortness of breath, fever, cough and chest pain at PAT appointment. Pt does endorse productive cough only in at night and in the morning when she wakes up. Phlegm is clear and after a few hours it is gone. She does not have any other associated symptoms (fever, sore throat, runny nose) but she was instructed to let us know if she does develop any of those symptoms. Discussed with Shonna Chock, PA-C   All instructions explained to the patient, with a verbal understanding of the material. Patient agrees to go over the instructions while at home for a better understanding. Patient also instructed to self quarantine after being tested for COVID-19. The opportunity to ask questions was provided.

## 2022-06-27 NOTE — Anesthesia Preprocedure Evaluation (Addendum)
Anesthesia Evaluation  Patient identified by MRN, date of birth, ID band Patient awake    Reviewed: Allergy & Precautions, NPO status , Patient's Chart, lab work & pertinent test results  History of Anesthesia Complications (+) PONV and history of anesthetic complications  Airway Mallampati: III  TM Distance: >3 FB Neck ROM: Full    Dental  (+) Dental Advisory Given,    Pulmonary neg shortness of breath, neg sleep apnea, neg COPD, neg recent URI, former smoker   Pulmonary exam normal breath sounds clear to auscultation       Cardiovascular hypertension (HCTZ, metoprolol), Pt. on medications and Pt. on home beta blockers (-) angina (-) Past MI, (-) Cardiac Stents and (-) CABG + dysrhythmias (palpitations)  Rhythm:Regular Rate:Normal  HLD  Myocardial Perfusion Scan 10/06/2020:  Lexiscan stress is electrically nondiagnostic for ischemia due to baseline changes  Myoview scan shows normal perfusion. No significant ischemia  LVEF calculated at 70%  This is a low risk study.  TTE 05/05/2019: IMPRESSIONS     1. Left ventricular ejection fraction, by estimation, is 65 to 70%. The  left ventricle has normal function. The left ventricle has no regional  wall motion abnormalities. Left ventricular diastolic parameters are  consistent with Grade I diastolic  dysfunction (impaired relaxation).   2. Right ventricular systolic function is normal. The right ventricular  size is normal. Tricuspid regurgitation signal is inadequate for assessing  PA pressure.   3. The mitral valve is grossly normal. No evidence of mitral valve  regurgitation. No evidence of mitral stenosis.   4. The aortic valve is tricuspid. Aortic valve regurgitation is not  visualized. No aortic stenosis is present.   5. The inferior vena cava is normal in size with greater than 50%  respiratory variability, suggesting right atrial pressure of 3 mmHg.       Neuro/Psych neg Seizures PSYCHIATRIC DISORDERS Anxiety     negative neurological ROS     GI/Hepatic Neg liver ROS,GERD  Medicated,,diverticulosis   Endo/Other  Pre-diabetes  Renal/GU negative Renal ROS     Musculoskeletal  (+) Arthritis ,  osteopenia   Abdominal  (+) + obese  Peds  Hematology negative hematology ROS (+)   Anesthesia Other Findings   Reproductive/Obstetrics                             Anesthesia Physical Anesthesia Plan  ASA: 2  Anesthesia Plan: General   Post-op Pain Management: Tylenol PO (pre-op)*   Induction: Intravenous  PONV Risk Score and Plan: 4 or greater and Ondansetron, Dexamethasone, Treatment may vary due to age or medical condition, Propofol infusion and TIVA  Airway Management Planned: LMA  Additional Equipment:   Intra-op Plan:   Post-operative Plan: Extubation in OR  Informed Consent: I have reviewed the patients History and Physical, chart, labs and discussed the procedure including the risks, benefits and alternatives for the proposed anesthesia with the patient or authorized representative who has indicated his/her understanding and acceptance.     Dental advisory given  Plan Discussed with: CRNA and Anesthesiologist  Anesthesia Plan Comments: (PAT note written 06/27/2022 by Shonna Chock, PA-C.  Risks of general anesthesia discussed including, but not limited to, sore throat, hoarse voice, chipped/damaged teeth, injury to vocal cords, nausea and vomiting, allergic reactions, lung infection, heart attack, stroke, and death. All questions answered.   )       Anesthesia Quick Evaluation

## 2022-06-27 NOTE — Progress Notes (Signed)
Anesthesia Chart Review:  Case: [id number] Date/Time: 07/03/22 0845   Procedure: RADIOACTIVE SEED GUIDED EXCISIONAL BREAST BIOPSY (Left) - LMA   Anesthesia type: General   Pre-op diagnosis: LEFT BREAST MASS   [facility] / [facility]   Surgeons: Emelia Loron, MD       DISCUSSION: Patient is a 67 year old female scheduled for the above procedure. By notes, recent left breast biopsy showed showed atypical ductal hyperplasia and lumpectomy recommended.  History includes former smoker (quit 03/19/00), post-operative N/V, HTN, HLD, pre-diabetes, GERD (mild Schatzki ring 2018).  She is not followed routinely by cardiology, but was previously evaluated by Dr. Antoine Poche last on 02/03/21. She was seen for HTN with abnormal EKG and some palpitations. EKG felt consistent with repolarization changes (negative T waves, more prominent in anterolateral leads). 2021 echo was normal and stress test in July 2022 was non-ischemic. Zio monitor showed no significant arrhythmias. No chest pain and new SOB. No further cardiac work-up recommended at that time and as needed follow-up.  She had pulmonology evaluation by Dr. Kendrick Fries on 01/17/22 for chronic globus sensation in her throat. She felt like mucous was around her vocal cords and sometimes caused her to cough. Symptoms noticed about a year prior after she had pneumonia. She also had recovered from COVID-19 after 12/21/22. No hypoxia. No SOB and wheezing. He felt symptoms were most likely due to GERD. Advised continue PPI, avoid eating within 3 hours of bedtime, sleep with HOB elevated, avoid fatty foods/alcohol/chocolate/caffeine/tobacco. Follow-up if worsening or no improvement and would consider referral to ENT. At PAT, she reported she continues to have intermittent cough at night and first thing in the morning only. Coughing resolved after clear phlegm is expelled. This has been chronic for over a year, and she does not otherwise feel sick. As mentioned, she  had previous pulmonology evaluation. Lungs clear. Normal spirometry 01/17/22. Also followed by GI Dr. Leone Payor. She had EGD in 2018 for dysphagia and food impaction sensation that showed mild erosive esophagitis, mild lower esophageal ring, no food impaction. Diverticulosis noted on 04/18/22 colonoscopy.  Last ASA 06/26/22. RSL scheduled for 07/02/22. Anesthesia team to evaluate on the day of surgery.    VS: BP 136/78   Pulse 85   Temp 36.5 C (Oral)   Resp 18   Ht 5\' 8"  (1.727 m)   Wt 109 kg   LMP 09/17/1990 (Approximate)   SpO2 96%   BMI 36.54 kg/m    PROVIDERS: Courtney Paris, NP is PCP  Rollene Rotunda, MD is cardiologist. As need cardiology follow-up recommended at 02/03/21 visit. Max Fickle, MD is pulmonologist Stan Head, MD is GI   LABS: Labs reviewed: Acceptable for surgery. (all labs ordered are listed, but only abnormal results are displayed)  Labs Reviewed  BASIC METABOLIC PANEL - Abnormal; Notable for the following components:      Result Value   Glucose, Bld 113 (*)    All other components within normal limits  CBC    OTHER: Colonoscopy 04/18/22:  IMPRESSION: Diverticulosis in the sigmoid colon and in the descending colon. The examination was otherwise normal on direct and retroflexion views.  No specimens collected. Personal history of colonic polyps.  09/2015 for diminutive adenomas.  03/31/2019 5 diminutive polyps adenomas  Spirometry 01/17/22: FVC 2.91 (82%). FEV1 2.24 (82%). FEV1/FVC 77% (102%). Normal spirometry.  EGD 11/08/16:  IMPRESSION: LA grade a reflux esophagitis.   Mild Schatzki ring. The examination was otherwise normal.  No specimens collected.   EKG:  EKG 06/26/22: Normal sinus rhythm Left ventricular hypertrophy with repolarization abnormality (R in aVL) Cannot rule out septal infarct, age undetermined - Negative anterolateral T waves, also present on prior tracing from 09/22/20. She had a non-ischemic, low risk stress test on 10/06/20.    EKG 09/22/20 ([facility]): Normal sinus rhythm Left ventricular hypertrophy with repolarization abnormality - Negative T wave in worse in anterolateral leads and more non-specific flattening in inferior leads  EKG 04/21/19 ([facility]):  Normal sinus rhythm Nonspecific ST and T wave abnormality  EKG 04/06/19 [facility] [place]): Atrial rhythm First-degree AV block Nonspecific ST depression and negative T waves - Diffuse negative T waves, more prominent in precordial leads.   CV: Nuclear stress test 10/06/20: Lexiscan stress is electrically nondiagnostic for ischemia due to baseline changes Myoview scan shows normal perfusion. No significant ischemia LVEF calculated at 70% This is a low risk study.   Long term ZioXT monitor 08/25/20 - 09/08/20: The predominant rhythm is sinus Four episodes of ventricular tachycardia non sustained with the longest run being 7 beats.   Frequent runs of supraventricular tachycardia with the longest lasting 11.7 seconds Possible atrial tachycardia.  - Per Rollene Rotunda, MD "No significant arrhythmias.  No change in therapy."   Echo 05/05/19: IMPRESSIONS   1. Left ventricular ejection fraction, by estimation, is 65 to 70%. The  left ventricle has normal function. The left ventricle has no regional  wall motion abnormalities. Left ventricular diastolic parameters are  consistent with Grade I diastolic  dysfunction (impaired relaxation).   2. Right ventricular systolic function is normal. The right ventricular  size is normal. Tricuspid regurgitation signal is inadequate for assessing  PA pressure.   3. The mitral valve is grossly normal. No evidence of mitral valve  regurgitation. No evidence of mitral stenosis.   4. The aortic valve is tricuspid. Aortic valve regurgitation is not  visualized. No aortic stenosis is present.   5. The inferior vena cava is normal in size with greater than 50%  respiratory variability, suggesting  right atrial pressure of 3 mmHg.  - Comparison(s): No prior Echocardiogram.     Past Medical History:  Diagnosis Date   Anxiety    Arthritis    Chronic RLQ pain 1989   secondary to adhesions   Diverticulosis of colon (without mention of hemorrhage)    Esophageal obstruction    GERD with stricture    Hx of adenomatous colonic polyps 10/07/2015   Hyperlipidemia    NO MEDS   Hypertension    Osteopenia    Pneumonia    Hx. Last time 2018   PONV (postoperative nausea and vomiting)    Pre-diabetes    Premature menopause age 69   On HRT age 47 - 2007   Smoker    Trigger thumb of right hand 09/2016   Umbilical hernia    Vitamin D deficiency     Past Surgical History:  Procedure Laterality Date   BREAST BIOPSY Left 05/15/2022   [place] LT BREAST BX W LOC DEV 1ST LESION IMG BX SPEC US GUIDE 05/15/2022 [facility]   BREAST EXCISIONAL BIOPSY Right 06/11/2006   BUNIONECTOMY Right    CHEILECTOMY Right 11/2004   great toe    COLONOSCOPY  1988   normal   COLONOSCOPY  11/1996   normal   COLONOSCOPY  01/2002   normal   COLONOSCOPY  03/2022   COLONOSCOPY W/ BIOPSIES  03/2008   mild diverticula - recheck 7 years   ESOPHAGOGASTRODUODENOSCOPY N/A 11/08/2016  Procedure: ESOPHAGOGASTRODUODENOSCOPY (EGD);  Surgeon: Iva BoopGessner, Carl E, MD;  Location: [facility];  Service: Endoscopy;  Laterality: N/A;   HYSTEROSCOPY  2003   LAPAROSCOPIC OVARIAN CYSTECTOMY Right 1985   MM BREAST STEREO BX*L*R/S Right 06/2006   bloody nipple discharge - path reveals intraductal papilloma Dr. Maple HudsonYoung   TONSILLECTOMY     As a child   TRIGGER FINGER RELEASE Right 09/18/2016   Procedure: RELEASE TRIGGER FINGER/A-1 PULLEY RIGHT THUMB;  Surgeon: Cindee SaltKuzma, Gary, MD;  Location: [facility];  Service: Orthopedics;  Laterality: Right;  REG/FAB   UPPER GASTROINTESTINAL ENDOSCOPY      MEDICATIONS:  acetaminophen (TYLENOL) 500 MG tablet   albuterol (VENTOLIN HFA) 108 (90 Base) MCG/ACT inhaler    ALPRAZolam (XANAX) 0.5 MG tablet   alum & mag hydroxide-simeth (MAALOX/MYLANTA) 200-200-20 MG/5ML suspension   ascorbic acid (VITAMIN C) 500 MG tablet   ASPIRIN 81 PO   benzonatate (TESSALON) 100 MG capsule   calcium carbonate (TUMS EX) 750 MG chewable tablet   Cholecalciferol (VITAMIN D3) 50 MCG (2000 UT) CAPS   Cranberry 400 MG CAPS   cyclobenzaprine (FLEXERIL) 5 MG tablet   FIBER PO   glucosamine-chondroitin 500-400 MG tablet   hydrochlorothiazide (HYDRODIURIL) 25 MG tablet   ibuprofen (ADVIL,MOTRIN) 400 MG tablet   metoprolol succinate (TOPROL-XL) 50 MG 24 hr tablet   Multiple Vitamin (MULTIVITAMIN) tablet   OVER THE COUNTER MEDICATION   pantoprazole (PROTONIX) 40 MG tablet   Polyethyl Glycol-Propyl Glycol (SYSTANE) 0.4-0.3 % SOLN   sodium chloride (OCEAN) 0.65 % SOLN nasal spray   tiZANidine (ZANAFLEX) 4 MG tablet   TURMERIC PO   valACYclovir (VALTREX) 500 MG tablet   No current facility-administered medications for this encounter.    Shonna ChockAllison Antione Obar, PA-C Surgical Short Stay/Anesthesiology [facility] Phone [phone] [facility] Phone [phone] 06/27/2022 3:12 PM

## 2022-07-02 ENCOUNTER — Ambulatory Visit
Admission: RE | Admit: 2022-07-02 | Discharge: 2022-07-02 | Disposition: A | Discharge status: Home / Self Care | Payer: Medicare Other | Source: Ambulatory Visit | Attending: General Surgery | Admitting: General Surgery

## 2022-07-02 DIAGNOSIS — N6092 Unspecified benign mammary dysplasia of left breast: Secondary | ICD-10-CM

## 2022-07-02 HISTORY — PX: BREAST BIOPSY: SHX20

## 2022-07-03 ENCOUNTER — Ambulatory Visit (HOSPITAL_BASED_OUTPATIENT_CLINIC_OR_DEPARTMENT_OTHER): Payer: Medicare Other | Admitting: Anesthesiology

## 2022-07-03 ENCOUNTER — Ambulatory Visit
Admission: RE | Admit: 2022-07-03 | Discharge: 2022-07-03 | Disposition: A | Discharge status: Home / Self Care | Payer: Medicare Other | Source: Ambulatory Visit | Attending: General Surgery | Admitting: General Surgery

## 2022-07-03 ENCOUNTER — Ambulatory Visit (HOSPITAL_COMMUNITY): Payer: Medicare Other | Admitting: Vascular Surgery

## 2022-07-03 ENCOUNTER — Other Ambulatory Visit: Payer: Self-pay

## 2022-07-03 ENCOUNTER — Encounter (HOSPITAL_COMMUNITY): Payer: Self-pay | Admitting: General Surgery

## 2022-07-03 ENCOUNTER — Ambulatory Visit (HOSPITAL_COMMUNITY)
Admission: RE | Admit: 2022-07-03 | Discharge: 2022-07-03 | Disposition: A | Discharge status: Home / Self Care | Payer: Medicare Other | Attending: General Surgery | Admitting: General Surgery

## 2022-07-03 ENCOUNTER — Encounter (HOSPITAL_COMMUNITY)
Admission: RE | Disposition: A | Discharge status: Home / Self Care | Payer: Self-pay | Source: Home / Self Care | Attending: General Surgery

## 2022-07-03 DIAGNOSIS — N6489 Other specified disorders of breast: Secondary | ICD-10-CM

## 2022-07-03 DIAGNOSIS — Z8249 Family history of ischemic heart disease and other diseases of the circulatory system: Secondary | ICD-10-CM | POA: Insufficient documentation

## 2022-07-03 DIAGNOSIS — N6092 Unspecified benign mammary dysplasia of left breast: Secondary | ICD-10-CM | POA: Diagnosis not present

## 2022-07-03 DIAGNOSIS — R7303 Prediabetes: Secondary | ICD-10-CM | POA: Diagnosis not present

## 2022-07-03 DIAGNOSIS — K219 Gastro-esophageal reflux disease without esophagitis: Secondary | ICD-10-CM | POA: Diagnosis not present

## 2022-07-03 DIAGNOSIS — E785 Hyperlipidemia, unspecified: Secondary | ICD-10-CM | POA: Insufficient documentation

## 2022-07-03 DIAGNOSIS — I10 Essential (primary) hypertension: Secondary | ICD-10-CM | POA: Diagnosis not present

## 2022-07-03 DIAGNOSIS — N6320 Unspecified lump in the left breast, unspecified quadrant: Secondary | ICD-10-CM | POA: Diagnosis present

## 2022-07-03 HISTORY — PX: RADIOACTIVE SEED GUIDED EXCISIONAL BREAST BIOPSY: SHX6490

## 2022-07-03 SURGERY — RADIOACTIVE SEED GUIDED BREAST BIOPSY
Anesthesia: General | Laterality: Left

## 2022-07-03 MED ORDER — AMISULPRIDE (ANTIEMETIC) 5 MG/2ML IV SOLN
INTRAVENOUS | Status: AC
  Filled 2022-07-03: qty 4

## 2022-07-03 MED ORDER — CLINDAMYCIN PHOSPHATE 900 MG/50ML IV SOLN
900.0000 mg | INTRAVENOUS | Status: AC
  Administered 2022-07-03: 900 mg via INTRAVENOUS
  Filled 2022-07-03: qty 50

## 2022-07-03 MED ORDER — PROPOFOL 10 MG/ML IV BOLUS
INTRAVENOUS | Status: DC | PRN
  Administered 2022-07-03: 200 mg via INTRAVENOUS

## 2022-07-03 MED ORDER — BUPIVACAINE HCL (PF) 0.25 % IJ SOLN
INTRAMUSCULAR | Status: DC | PRN
  Administered 2022-07-03: 10 mL

## 2022-07-03 MED ORDER — ACETAMINOPHEN 500 MG PO TABS: 1000.0000 mg | ORAL_TABLET | ORAL | Status: DC

## 2022-07-03 MED ORDER — FENTANYL CITRATE (PF) 250 MCG/5ML IJ SOLN
INTRAMUSCULAR | Status: DC | PRN
  Administered 2022-07-03: 50 ug via INTRAVENOUS

## 2022-07-03 MED ORDER — LIDOCAINE 2% (20 MG/ML) 5 ML SYRINGE
INTRAMUSCULAR | Status: AC
  Filled 2022-07-03: qty 5

## 2022-07-03 MED ORDER — CHLORHEXIDINE GLUCONATE CLOTH 2 % EX PADS: 6.0000 | MEDICATED_PAD | Freq: Once | CUTANEOUS | Status: DC

## 2022-07-03 MED ORDER — DEXAMETHASONE SODIUM PHOSPHATE 10 MG/ML IJ SOLN
INTRAMUSCULAR | Status: DC | PRN
  Administered 2022-07-03: 10 mg via INTRAVENOUS

## 2022-07-03 MED ORDER — LIDOCAINE 2% (20 MG/ML) 5 ML SYRINGE
INTRAMUSCULAR | Status: DC | PRN
  Administered 2022-07-03: 100 mg via INTRAVENOUS

## 2022-07-03 MED ORDER — OXYCODONE HCL 5 MG/5ML PO SOLN: 5.0000 mg | Freq: Once | ORAL | Status: DC | PRN

## 2022-07-03 MED ORDER — DEXAMETHASONE SODIUM PHOSPHATE 10 MG/ML IJ SOLN
INTRAMUSCULAR | Status: AC
  Filled 2022-07-03: qty 1

## 2022-07-03 MED ORDER — PHENYLEPHRINE 80 MCG/ML (10ML) SYRINGE FOR IV PUSH (FOR BLOOD PRESSURE SUPPORT)
PREFILLED_SYRINGE | INTRAVENOUS | Status: DC | PRN
  Administered 2022-07-03 (×2): 160 ug via INTRAVENOUS
  Administered 2022-07-03: 80 ug via INTRAVENOUS

## 2022-07-03 MED ORDER — AMISULPRIDE (ANTIEMETIC) 5 MG/2ML IV SOLN
10.0000 mg | Freq: Once | INTRAVENOUS | Status: AC | PRN
  Administered 2022-07-03: 10 mg via INTRAVENOUS

## 2022-07-03 MED ORDER — OXYCODONE HCL 5 MG PO TABS: 5.0000 mg | ORAL_TABLET | Freq: Once | ORAL | Status: DC | PRN

## 2022-07-03 MED ORDER — PROPOFOL 500 MG/50ML IV EMUL
INTRAVENOUS | Status: DC | PRN
  Administered 2022-07-03: 150 ug/kg/min via INTRAVENOUS

## 2022-07-03 MED ORDER — ROCURONIUM BROMIDE 10 MG/ML (PF) SYRINGE
PREFILLED_SYRINGE | INTRAVENOUS | Status: AC
  Filled 2022-07-03: qty 10

## 2022-07-03 MED ORDER — BUPIVACAINE HCL (PF) 0.25 % IJ SOLN
INTRAMUSCULAR | Status: AC
  Filled 2022-07-03: qty 30

## 2022-07-03 MED ORDER — ONDANSETRON HCL 4 MG/2ML IJ SOLN
INTRAMUSCULAR | Status: AC
  Filled 2022-07-03: qty 2

## 2022-07-03 MED ORDER — ENSURE PRE-SURGERY PO LIQD: 296.0000 mL | Freq: Once | ORAL | Status: DC

## 2022-07-03 MED ORDER — ONDANSETRON HCL 4 MG/2ML IJ SOLN
INTRAMUSCULAR | Status: DC | PRN
  Administered 2022-07-03: 4 mg via INTRAVENOUS

## 2022-07-03 MED ORDER — CHLORHEXIDINE GLUCONATE 0.12 % MT SOLN
15.0000 mL | Freq: Once | OROMUCOSAL | Status: AC
  Administered 2022-07-03: 15 mL via OROMUCOSAL
  Filled 2022-07-03: qty 15

## 2022-07-03 MED ORDER — PROPOFOL 10 MG/ML IV BOLUS
INTRAVENOUS | Status: AC
  Filled 2022-07-03: qty 20

## 2022-07-03 MED ORDER — ORAL CARE MOUTH RINSE: 15.0000 mL | Freq: Once | OROMUCOSAL | Status: AC

## 2022-07-03 MED ORDER — FENTANYL CITRATE (PF) 250 MCG/5ML IJ SOLN
INTRAMUSCULAR | Status: AC
  Filled 2022-07-03: qty 5

## 2022-07-03 MED ORDER — PHENYLEPHRINE HCL-NACL 20-0.9 MG/250ML-% IV SOLN
INTRAVENOUS | Status: DC | PRN
  Administered 2022-07-03: 30 ug/min via INTRAVENOUS

## 2022-07-03 MED ORDER — LACTATED RINGERS IV SOLN: INTRAVENOUS | Status: DC

## 2022-07-03 MED ORDER — FENTANYL CITRATE (PF) 100 MCG/2ML IJ SOLN: 25.0000 ug | INTRAMUSCULAR | Status: DC | PRN

## 2022-07-03 SURGICAL SUPPLY — 44 items
ADH SKN CLS APL DERMABOND .7 (GAUZE/BANDAGES/DRESSINGS) ×1
ADH SKN CLS LQ APL DERMABOND (GAUZE/BANDAGES/DRESSINGS) ×1
APL PRP STRL LF DISP 70% ISPRP (MISCELLANEOUS) ×1
APPLIER CLIP 9.375 MED OPEN (MISCELLANEOUS)
APR CLP MED 9.3 20 MLT OPN (MISCELLANEOUS)
BAG COUNTER SPONGE SURGICOUNT (BAG) ×1 IMPLANT
BAG SPNG CNTER NS LX DISP (BAG) ×1
BINDER BREAST LRG (GAUZE/BANDAGES/DRESSINGS) IMPLANT
BINDER BREAST XLRG (GAUZE/BANDAGES/DRESSINGS) IMPLANT
CANISTER SUCT 3000ML PPV (MISCELLANEOUS) ×1 IMPLANT
CHLORAPREP W/TINT 26 (MISCELLANEOUS) ×1 IMPLANT
CLIP APPLIE 9.375 MED OPEN (MISCELLANEOUS) IMPLANT
CLIP TI MEDIUM 6 (CLIP) ×1 IMPLANT
COVER PROBE W GEL 5X96 (DRAPES) ×1 IMPLANT
COVER SURGICAL LIGHT HANDLE (MISCELLANEOUS) ×1 IMPLANT
DERMABOND ADVANCED .7 DNX12 (GAUZE/BANDAGES/DRESSINGS) ×1 IMPLANT
DERMABOND ADVANCED .7 DNX6 (GAUZE/BANDAGES/DRESSINGS) IMPLANT
DEVICE DUBIN SPECIMEN MAMMOGRA (MISCELLANEOUS) ×1 IMPLANT
DRAPE CHEST BREAST 15X10 FENES (DRAPES) ×1 IMPLANT
ELECT COATED BLADE 2.86 ST (ELECTRODE) ×1 IMPLANT
ELECT REM PT RETURN 9FT ADLT (ELECTROSURGICAL) ×1
ELECTRODE REM PT RTRN 9FT ADLT (ELECTROSURGICAL) ×1 IMPLANT
GLOVE BIO SURGEON STRL SZ7 (GLOVE) ×2 IMPLANT
GLOVE BIOGEL PI IND STRL 7.5 (GLOVE) ×1 IMPLANT
GOWN STRL REUS W/ TWL LRG LVL3 (GOWN DISPOSABLE) ×2 IMPLANT
GOWN STRL REUS W/TWL LRG LVL3 (GOWN DISPOSABLE) ×2
KIT BASIN OR (CUSTOM PROCEDURE TRAY) ×1 IMPLANT
KIT MARKER MARGIN INK (KITS) ×1 IMPLANT
LIGHT WAVEGUIDE WIDE FLAT (MISCELLANEOUS) IMPLANT
NDL HYPO 25GX1X1/2 BEV (NEEDLE) ×1 IMPLANT
NEEDLE HYPO 25GX1X1/2 BEV (NEEDLE) ×1 IMPLANT
NS IRRIG 1000ML POUR BTL (IV SOLUTION) ×1 IMPLANT
PACK GENERAL/GYN (CUSTOM PROCEDURE TRAY) ×1 IMPLANT
STRIP CLOSURE SKIN 1/2X4 (GAUZE/BANDAGES/DRESSINGS) ×1 IMPLANT
SUT MNCRL AB 4-0 PS2 18 (SUTURE) ×1 IMPLANT
SUT MON AB 5-0 PS2 18 (SUTURE) IMPLANT
SUT SILK 2 0 SH (SUTURE) IMPLANT
SUT VIC AB 2-0 SH 27 (SUTURE) ×2
SUT VIC AB 2-0 SH 27XBRD (SUTURE) ×1 IMPLANT
SUT VIC AB 3-0 SH 27 (SUTURE) ×2
SUT VIC AB 3-0 SH 27X BRD (SUTURE) ×1 IMPLANT
SYR CONTROL 10ML LL (SYRINGE) ×1 IMPLANT
TOWEL GREEN STERILE (TOWEL DISPOSABLE) ×1 IMPLANT
TOWEL GREEN STERILE FF (TOWEL DISPOSABLE) ×1 IMPLANT

## 2022-07-03 NOTE — Anesthesia Procedure Notes (Addendum)
Procedure Name: LMA Insertion Date/Time: 07/03/2022 8:54 AM  Performed by: Linton Rump, MDPre-anesthesia Checklist: Patient identified, Emergency Drugs available, Suction available and Patient being monitored Patient Re-evaluated:Patient Re-evaluated prior to induction Oxygen Delivery Method: Circle system utilized Preoxygenation: Pre-oxygenation with 100% oxygen Induction Type: IV induction Ventilation: Mask ventilation without difficulty LMA: LMA inserted LMA Size: 4.0 Number of attempts: 1 Placement Confirmation: positive ETCO2 and breath sounds checked- equal and bilateral Tube secured with: Tape Dental Injury: Teeth and Oropharynx as per pre-operative assessment  Comments: LMA placed by Natividad Brood SRNA

## 2022-07-03 NOTE — H&P (Signed)
  30 yof who has prior right ex biopsy for papilloma and no fh. Had a screening mm that shows b density breast tissue. She had a left breast mass noted that was 4x6x67mm on exam. No ax adenopathy. Pathology shows a papilloma with adh. She has no mass or dc.   Review of Systems: A complete review of systems was obtained from the patient. I have reviewed this information and discussed as appropriate with the patient. See HPI as well for other ROS.  Review of Systems  All other systems reviewed and are negative.   Medical History: Past Medical History:  Diagnosis Date  Anxiety  Arthritis  GERD (gastroesophageal reflux disease)  Hyperlipidemia  Hypertension   Past Surgical History:  Procedure Laterality Date  BREAST EXCISIONAL BIOPSY  hand surgery  LAPAROSCOPIC OVARIAN CYSTECTOMY   Allergies  Allergen Reactions  Cephalexin Itching  itching Codeine Nausea And Vomiting  REACTION: nausea Doxycycline Other (See Comments)  Slight itching  Amoxicillin-Pot Clavulanate Rash  REACTION: rash Cefaclor Rash  REACTION: rash Penicillin Rash   Current Outpatient Medications on File Prior to Visit  Medication Sig Dispense Refill  ALPRAZolam (XANAX) 0.5 MG tablet  ascorbic acid, vitamin C, (VITAMIN C) 500 MG tablet Take by mouth once daily  cranberry 400 mg Cap Take by mouth once daily  glucosamine su 2KCl-chondroit 500-400 mg Tab Take 1 tablet by mouth once daily  hydroCHLOROthiazide (HYDRODIURIL) 25 MG tablet  ibuprofen (MOTRIN) 400 MG tablet Take by mouth  metoprolol succinate (TOPROL-XL) 50 MG XL tablet Take by mouth  pantoprazole (PROTONIX) 40 MG DR tablet  valACYclovir (VALTREX) 500 MG tablet    Family History  Problem Relation Age of Onset  Obesity Mother  High blood pressure (Hypertension) Mother    Social History   Tobacco Use  Smoking Status Never  Smokeless Tobacco Never  Marital status: Married  Tobacco Use  Smoking status: Never  Smokeless tobacco: Never   Vaping Use  Vaping Use: Never used  Substance and Sexual Activity  Alcohol use: Never  Drug use: Never   Objective:   Vitals:  06/06/22 0912  PainSc: 0-No pain   Physical Exam Vitals reviewed.  Constitutional:  Appearance: Normal appearance.  Chest:  Breasts: Right: No inverted nipple, mass or nipple discharge.  Left: No inverted nipple, mass or nipple discharge.  Lymphadenopathy:  Upper Body:  Right upper body: No supraclavicular or axillary adenopathy.  Left upper body: No supraclavicular or axillary adenopathy.  Neurological:  Mental Status: She is alert.    Assessment and Plan:   Atypical ductal hyperplasia of left breast  Left breast seed guided excisional biopsy  We discussed the mammogram and the biopsy results today. Due to the atypical ductal hyperplasia and the risk of upgrade I recommended a left breast seed guided excisional biopsy. We discussed the procedure, risks, and recovery. We will proceed

## 2022-07-03 NOTE — Discharge Instructions (Signed)
[facility] Office Phone Number [phone]  POST OP INSTRUCTIONS Take 400 mg of ibuprofen every 8 hours or 650 mg tylenol every 6 hours for next 72 hours then as needed. Use ice several times daily also.  A prescription for pain medication may be given to you upon discharge.  Take your pain medication as prescribed, if needed.  If narcotic pain medicine is not needed, then you may take acetaminophen (Tylenol), naprosyn (Alleve) or ibuprofen (Advil) as needed. Take your usually prescribed medications unless otherwise directed If you need a refill on your pain medication, please contact your pharmacy.  They will contact our office to request authorization.  Prescriptions will not be filled after 5pm or on week-ends. You should eat very light the first 24 hours after surgery, such as soup, crackers, pudding, etc.  Resume your normal diet the day after surgery. Most patients will experience some swelling and bruising in the breast.  Ice packs and a good support bra will help.  Wear the breast binder provided or a sports bra for 72 hours day and night.  After that wear a sports bra during the day until you return to the office. Swelling and bruising can take several days to resolve.  It is common to experience some constipation if taking pain medication after surgery.  Increasing fluid intake and taking a stool softener will usually help or prevent this problem from occurring.  A mild laxative (Milk of Magnesia or Miralax) should be taken according to package directions if there are no bowel movements after 48 hours. I used skin glue on the incision, you may shower in 24 hours.  The glue will flake off over the next 2-3 weeks.  Any sutures or staples will be removed at the office during your follow-up visit. ACTIVITIES:  You may resume regular daily activities (gradually increasing) beginning the next day.  Wearing a good support bra or sports bra minimizes pain and swelling.  You may have  sexual intercourse when it is comfortable. You may drive when you no longer are taking prescription pain medication, you can comfortably wear a seatbelt, and you can safely maneuver your car and apply brakes. RETURN TO WORK:  ______________________________________________________________________________________ You should see your doctor in the office for a follow-up appointment approximately two weeks after your surgery.  Your doctor's nurse will typically make your follow-up appointment when she calls you with your pathology report.  Expect your pathology report 3-4 business days after your surgery.  You may call to check if you do not hear from us after three days. OTHER INSTRUCTIONS: _______________________________________________________________________________________________ _____________________________________________________________________________________________________________________________________ _____________________________________________________________________________________________________________________________________ _____________________________________________________________________________________________________________________________________  WHEN TO CALL DR Chianne Byrns: Fever over 101.0 Nausea and/or vomiting. Extreme swelling or bruising. Continued bleeding from incision. Increased pain, redness, or drainage from the incision.  The clinic staff is available to answer your questions during regular business hours.  Please don't hesitate to call and ask to speak to one of the nurses for clinical concerns.  If you have a medical emergency, go to the nearest emergency room or call 911.  A surgeon from [facility] is always on call at the hospital.  For further questions, please visit [email or web address]  

## 2022-07-03 NOTE — Anesthesia Postprocedure Evaluation (Signed)
Anesthesia Post Note  Patient: Alexandria Mercado  Procedure(s) Performed: RADIOACTIVE SEED GUIDED EXCISIONAL BREAST BIOPSY (Left)     Patient location during evaluation: PACU Anesthesia Type: General Level of consciousness: awake Pain management: pain level controlled Vital Signs Assessment: post-procedure vital signs reviewed and stable Respiratory status: spontaneous breathing, nonlabored ventilation and respiratory function stable Cardiovascular status: blood pressure returned to baseline and stable Postop Assessment: no apparent nausea or vomiting Anesthetic complications: no   No notable events documented.  Last Vitals:  Vitals:   07/03/22 1000 07/03/22 1015  BP: (!) 144/81 138/74  Pulse: 62 60  Resp: 16 10  Temp:  36.4 C  SpO2: 94% 96%    Last Pain:  Vitals:   07/03/22 0943  TempSrc:   PainSc: 0-No pain                 Linton Rump

## 2022-07-03 NOTE — Interval H&P Note (Signed)
History and Physical Interval Note:  07/03/2022 8:13 AM  Alexandria Mercado  has presented today for surgery, with the diagnosis of LEFT BREAST MASS.  The various methods of treatment have been discussed with the patient and family. After consideration of risks, benefits and other options for treatment, the patient has consented to  Procedure(s) with comments: RADIOACTIVE SEED GUIDED EXCISIONAL BREAST BIOPSY (Left) - LMA as a surgical intervention.  The patient's history has been reviewed, patient examined, no change in status, stable for surgery.  I have reviewed the patient's chart and labs.  Questions were answered to the patient's satisfaction.     Emelia Loron

## 2022-07-03 NOTE — Op Note (Signed)
   Preoperative diagnosis: Left breast mass with biopsy showing adh Postoperative diagnosis: Same as above Procedure: Left breast radioactive seed guided excisional biopsy Surgeon: Dr. Harden Mo Anesthesia: General Estimated blood loss: Minimal Complications: None Specimens: Left breast tissue containing radioactive seed and clip to pathology Sponge needle count was correct completion Disposition recovery stable condition   Indications:66 yof who has prior right ex biopsy for papilloma and no fh. Had a screening mm that shows b density breast tissue. She had a left breast mass noted that was 4x6x80mm on exam. No ax adenopathy. Pathology shows a papilloma with adh. She has no mass or dc. We discussed excisional biopsy  Procedure: After informed consent was obtained the patient was taken to the operating room.  She was given antibiotics.  SCDs were in place.  She was placed under general anesthesia without complication.  She was prepped and draped in the standard sterile surgical fashion.  A surgical timeout was then performed.   The seed was in the lower inner quadrant. I made a periareolar incision in order to hide the scar later.  I then dissected to the seed and removed the seed and the surrounding tissue.  Mammogram confirmed removal of the seed and the clip.   I then obtained hemostasis.  I closed this with 2-0 Vicryl.  The skin was closed with 3-0 Vicryl and 5-0 Monocryl.  Glue and Steri-Strips were applied.  She tolerated this well was extubated and transferred to recovery stable.

## 2022-07-03 NOTE — Transfer of Care (Signed)
Immediate Anesthesia Transfer of Care Note  Patient: Alexandria Mercado  Procedure(s) Performed: RADIOACTIVE SEED GUIDED EXCISIONAL BREAST BIOPSY (Left)  Patient Location: PACU  Anesthesia Type:General  Level of Consciousness: drowsy and patient cooperative  Airway & Oxygen Therapy: Patient Spontanous Breathing and Patient connected to nasal cannula oxygen  Post-op Assessment: Report given to RN, Post -op Vital signs reviewed and stable, and Patient moving all extremities X 4  Post vital signs: Reviewed and stable  Last Vitals:  Vitals Value Taken Time  BP 150/70   Temp    Pulse 67 07/03/22 0943  Resp 19 07/03/22 0943  SpO2 98 % 07/03/22 0943  Vitals shown include unvalidated device data.  Last Pain:  Vitals:   07/03/22 0724  TempSrc:   PainSc: 0-No pain      Patients Stated Pain Goal: 0 (07/03/22 0724)  Complications: No notable events documented.

## 2022-07-04 ENCOUNTER — Encounter (HOSPITAL_COMMUNITY): Payer: Self-pay | Admitting: General Surgery

## 2022-07-05 LAB — SURGICAL PATHOLOGY

## 2022-08-21 ENCOUNTER — Other Ambulatory Visit: Payer: Self-pay | Admitting: Nurse Practitioner

## 2022-08-21 NOTE — Telephone Encounter (Signed)
Medication refill request: valtrex  Last ov:  07/26/21 Next AEX: nothing scheduled  Last MMG (if hormonal medication request): n/a Refill authorized: #[id number] with 1 rf pended for today

## 2022-09-18 NOTE — Progress Notes (Unsigned)
  Cardiology Office Note:   Date:  09/19/2022  ID:  Alexandria Mercado, DOB [birth year], MRN [id number] PCP: Courtney Paris, NP  [facility] Providers Cardiologist:  Rollene Rotunda, MD {  History of Present Illness:   Alexandria Mercado is a 67 y.o. female who ifollows up for evaluation of HTN and an abnormal EKG.    She had some palpitations but there were no arrhythmias on reent monitor.  She had EKG changes consistent with repolarization changes but an echo was normal.    She had a negative perfusion stress test.   I saw her in 2022.  She had another EKG and this demonstrated T wave inversions in the anterolateral leads.  This was the same as 2022 and 2021 EKGs when I look at these for comparison.  She has had no new symptoms.  She has had some chronic shortness of breath with exertion but she recovers quickly.  The patient denies any new symptoms such as chest discomfort, neck or arm discomfort. There has been no new shortness of breath, PND or orthopnea. There have been no reported palpitations, presyncope or syncope.       ROS: As stated in the HPI and negative for all other systems.  Studies Reviewed:    EKG:   Normal sinus rhythm, rate 76, axis within normal limits, intervals within normal limits, T wave inversion V1 to V6 lead I and aVL unchanged from previous.  06/26/2022.   Risk Assessment/Calculations:              Physical Exam:   VS:  BP 134/84   Pulse 80   Ht 5\' 8"  (1.727 m)   Wt 240 lb 3.2 oz (109 kg)   LMP 09/17/1990 (Approximate)   SpO2 98%   BMI 36.52 kg/m    Wt Readings from Last 3 Encounters:  09/19/22 240 lb 3.2 oz (109 kg)  07/03/22 232 lb (105.2 kg)  06/26/22 240 lb 4.8 oz (109 kg)     GEN: Well nourished, well developed in no acute distress NECK: No JVD; No carotid bruits CARDIAC: RRR, no murmurs, rubs, gallops RESPIRATORY:  Clear to auscultation without rales, wheezing or rhonchi  ABDOMEN: Soft, non-tender, non-distended EXTREMITIES:  No edema; No  deformity   ASSESSMENT AND PLAN:   HTN:   Minimally elevated have prescribed weight loss to go along with current medications.   ABNORMAL EKG:    This is unchanged from previous.  She had an EKG that was evaluated with a negative perfusion study in 2022.  No further workup is suggested for this.   RISK REDUCTION: I would like to check a coronary calcium score to determine goals of therapy.  She should get a copy of her lipid profile.  Shortness of breath: This is chronic and possibly related to weight and deconditioning.  She had a negative perfusion study as above.     Follow up me as needed.   Signed, Rollene Rotunda, MD

## 2022-09-19 ENCOUNTER — Encounter: Payer: Self-pay | Admitting: Cardiology

## 2022-09-19 ENCOUNTER — Ambulatory Visit: Payer: Medicare Other | Attending: Cardiology | Admitting: Cardiology

## 2022-09-19 VITALS — BP 134/84 | HR 80 | Ht 68.0 in | Wt 240.2 lb

## 2022-09-19 DIAGNOSIS — I10 Essential (primary) hypertension: Secondary | ICD-10-CM

## 2022-09-19 DIAGNOSIS — R002 Palpitations: Secondary | ICD-10-CM

## 2022-09-19 DIAGNOSIS — R9431 Abnormal electrocardiogram [ECG] [EKG]: Secondary | ICD-10-CM

## 2022-09-19 NOTE — Patient Instructions (Signed)
    Testing/Procedures:  CORONARY CALCIUM SCORING CT SCAN AT THE [facility]   Follow-Up: At [facility], you and your health needs are our priority.  As part of our continuing mission to provide you with exceptional heart care, we have created designated Provider Care Teams.  These Care Teams include your primary Cardiologist (physician) and Advanced Practice Providers (APPs -  Physician Assistants and Nurse Practitioners) who all work together to provide you with the care you need, when you need it.  We recommend signing up for the patient portal called "MyChart".  Sign up information is provided on this After Visit Summary.  MyChart is used to connect with patients for Virtual Visits (Telemedicine).  Patients are able to view lab/test results, encounter notes, upcoming appointments, etc.  Non-urgent messages can be sent to your provider as well.   To learn more about what you can do with MyChart, go to [email or web address].    Your next appointment:    AS NEEDED

## 2022-10-25 ENCOUNTER — Ambulatory Visit (HOSPITAL_BASED_OUTPATIENT_CLINIC_OR_DEPARTMENT_OTHER): Payer: Medicare Other

## 2022-11-06 ENCOUNTER — Other Ambulatory Visit (HOSPITAL_BASED_OUTPATIENT_CLINIC_OR_DEPARTMENT_OTHER): Payer: Self-pay

## 2022-11-06 DIAGNOSIS — R5383 Other fatigue: Secondary | ICD-10-CM

## 2022-11-06 DIAGNOSIS — G4730 Sleep apnea, unspecified: Secondary | ICD-10-CM

## 2022-11-06 DIAGNOSIS — E669 Obesity, unspecified: Secondary | ICD-10-CM

## 2022-11-09 ENCOUNTER — Ambulatory Visit (HOSPITAL_BASED_OUTPATIENT_CLINIC_OR_DEPARTMENT_OTHER)
Admission: RE | Admit: 2022-11-09 | Discharge: 2022-11-09 | Disposition: A | Discharge status: Home / Self Care | Payer: Self-pay | Source: Ambulatory Visit | Attending: Cardiology | Admitting: Cardiology

## 2022-11-09 DIAGNOSIS — R9431 Abnormal electrocardiogram [ECG] [EKG]: Secondary | ICD-10-CM | POA: Insufficient documentation

## 2022-11-23 ENCOUNTER — Other Ambulatory Visit: Payer: Self-pay | Admitting: Internal Medicine

## 2023-01-11 LAB — LAB REPORT - SCANNED
A1c: 6.1
EGFR: 86

## 2023-01-17 ENCOUNTER — Ambulatory Visit: Payer: Medicare Other | Admitting: Internal Medicine

## 2023-01-17 ENCOUNTER — Encounter: Payer: Self-pay | Admitting: Internal Medicine

## 2023-01-17 VITALS — BP 112/74 | HR 67 | Ht 69.0 in | Wt 242.0 lb

## 2023-01-17 DIAGNOSIS — K429 Umbilical hernia without obstruction or gangrene: Secondary | ICD-10-CM | POA: Diagnosis not present

## 2023-01-17 DIAGNOSIS — R1319 Other dysphagia: Secondary | ICD-10-CM | POA: Diagnosis not present

## 2023-01-17 DIAGNOSIS — K219 Gastro-esophageal reflux disease without esophagitis: Secondary | ICD-10-CM

## 2023-01-17 NOTE — Patient Instructions (Addendum)
VISIT SUMMARY:  During today's visit, we discussed your recent difficulties with swallowing pills and certain foods, your ongoing management of gastroesophageal reflux disease (GERD), and your umbilical hernia. We have outlined a plan to address each of these issues.  YOUR PLAN:  -DYSPHAGIA: Dysphagia means difficulty swallowing. You have been experiencing this intermittently with pills and certain foods for the past few months. We will schedule an upper endoscopy to examine your esophagus and potentially perform a dilation if necessary.  -GASTROESOPHAGEAL REFLUX DISEASE (GERD): GERD is a chronic condition where stomach acid frequently flows back into the esophagus, causing irritation. You are currently managing this with Pantoprazole 40mg  daily. Continue taking this medication as prescribed.  -UMBILICAL HERNIA: An umbilical hernia occurs when part of the intestine protrudes through an opening in the abdominal muscles. Your hernia is soft, reducible, and non-tender, with no changes in size or symptoms. We will continue to monitor it without any intervention at this time.  INSTRUCTIONS:  We will schedule an upper endoscopy to evaluate your esophagus and potentially perform dilation if needed. Please continue taking Pantoprazole 40mg  daily for your GERD. Monitor your umbilical hernia for any changes or symptoms and report them if they occur.  You have been scheduled for an endoscopy. Please follow written instructions given to you at your visit today.  If you use inhalers (even only as needed), please bring them with you on the day of your procedure.  If you take any of the following medications, they will need to be adjusted prior to your procedure:   DO NOT TAKE 7 DAYS PRIOR TO TEST- Trulicity (dulaglutide) Ozempic, Wegovy (semaglutide) Mounjaro (tirzepatide) Bydureon Bcise (exanatide extended release)  DO NOT TAKE 1 DAY PRIOR TO YOUR TEST Rybelsus (semaglutide) Adlyxin  (lixisenatide) Victoza (liraglutide) Byetta (exanatide) ___________________________________________________________________________   I appreciate the opportunity to care for you. Stan Head, MD, [facility]

## 2023-01-17 NOTE — Progress Notes (Signed)
Alexandria Mercado 67 y.o. 04-Jul-1955 [id number]  Assessment & Plan:   Encounter Diagnoses  Name Primary?   Esophageal dysphagia Yes   Gastroesophageal reflux disease, unspecified whether esophagitis present    Umbilical hernia without obstruction and without gangrene    Assessment and Plan    Dysphagia Intermittent difficulty swallowing pills and certain foods (potatoes, hash browns, grainy foods) for the past 2-3 months. No consistent pattern. Previous history of food impaction. -Schedule an upper endoscopy to evaluate esophagus and potentially perform dilation if needed.  Gastroesophageal Reflux Disease (GERD) Chronic condition, currently on Pantoprazole 40mg . Reports frequent throat clearing, possibly related to reflux. -Continue Pantoprazole 40mg  daily.  Umbilical Hernia Soft, reducible, and non-tender on examination. No change in size or symptoms. -Continue monitoring, no intervention needed at this time.          Subjective:  Discussed the use of AI scribe software for clinical note transcription with the patient, who gave verbal consent to proceed.  Chief Complaint: dysphagia  HPI   History of Present Illness   The patient, known to have a history of gastroesophageal reflux disease (GERD), presents with a few months' history of intermittent difficulty swallowing, primarily with pills. She describes a sensation of the pill getting stuck, occasionally regurgitating it. This issue seems to be more pronounced with larger pills such as vitamins and Tylenol. She also reported a recent episode of difficulty swallowing a hash brown, but generally, she does not have trouble with food. The sensation of obstruction is felt in the neck, leading to coughing, which is usually relieved by drinking fluids.  In addition to the swallowing issue, the patient reports frequent throat clearing, which she describes as feeling like she has a lot of mucus that she needs to cough up. She is  currently on pantoprazole for her GERD, which she believes is still effective. She had previously attempted to discontinue this medication but had to restart it.  The patient also has a known umbilical hernia, which remains soft and reducible, and has not changed in size or become tender.      Previous upper GI history: EGD 11/08/2016 for foreign body question food impaction she had a mild Schatzki ring and grade a reflux esophagitis otherwise normal.  It was suspected that she had had a food impaction that it passed by the time of EGD.  Treated for reflux with PPI after that.  She had consistently tried to lower dose and come off PPI due to concerns about side effects.  When last seen by me in March 2022 she went back on PPI because it controlled her symptoms.  Colonoscopy 04/18/2022 Impression:               - Diverticulosis in the sigmoid colon and in the                            descending colon.                           - The examination was otherwise normal on direct                            and retroflexion views.                           - No specimens collected.                           -  Personal history of colonic polyps. 09/2015 4                            diminutive adenomas                           03/31/2019 5 diminutive polyps adenomas  Recall planned for 2029  Allergies  Allergen Reactions   Amoxicillin-Pot Clavulanate     REACTION: rash   Ceclor [Cefaclor]     REACTION: rash   Codeine     REACTION: nausea   Doxycycline     Slight itching   Keflex [Cephalexin]     itching   Current Meds  Medication Sig   acetaminophen (TYLENOL) 500 MG tablet Take 1,000 mg by mouth every 6 (six) hours as needed for moderate pain or mild pain.   albuterol (VENTOLIN HFA) 108 (90 Base) MCG/ACT inhaler Inhale 1 puff into the lungs every 6 (six) hours as needed for wheezing or shortness of breath.   ALPRAZolam (XANAX) 0.5 MG tablet Take 0.5 mg by mouth every 8 (eight) hours as  needed for anxiety.   alum & mag hydroxide-simeth (MAALOX/MYLANTA) 200-200-20 MG/5ML suspension Take 30 mLs by mouth every 6 (six) hours as needed for indigestion or heartburn (Upset stomach, nausea).   ascorbic acid (VITAMIN C) 500 MG tablet Take 500 mg by mouth daily.   ASPIRIN 81 PO Take 81 mg by mouth daily.   benzonatate (TESSALON) 100 MG capsule Take 100 mg by mouth daily as needed for cough.   calcium carbonate (TUMS EX) 750 MG chewable tablet Chew 2 tablets by mouth daily as needed for heartburn (nausea).   Cholecalciferol (VITAMIN D3) 50 MCG (2000 UT) CAPS Take 2,000 Units by mouth daily.   Cranberry 400 MG CAPS Take 400 mg by mouth daily.   cyclobenzaprine (FLEXERIL) 10 MG tablet Take 10 mg by mouth 3 (three) times daily as needed.   cyclobenzaprine (FLEXERIL) 5 MG tablet Take 5 mg by mouth 3 (three) times daily as needed for muscle spasms.   escitalopram (LEXAPRO) 10 MG tablet Take 10 mg by mouth daily.   glucosamine-chondroitin 500-400 MG tablet Take 1 tablet by mouth daily.   hydrochlorothiazide (HYDRODIURIL) 25 MG tablet Take 25 mg by mouth daily.   ibuprofen (ADVIL,MOTRIN) 400 MG tablet Take 200-400 mg by mouth every 6 (six) hours as needed for moderate pain or mild pain.   metoprolol succinate (TOPROL-XL) 50 MG 24 hr tablet Take 50 mg by mouth daily.   Multiple Vitamin (MULTIVITAMIN) tablet Take 1 tablet by mouth daily.   OVER THE COUNTER MEDICATION Take 1 tablet by mouth daily. ultima flora   pantoprazole (PROTONIX) 40 MG tablet TAKE ONE TABLET BY MOUTH DAILY BEFORE BREAKFAST   Polyethyl Glycol-Propyl Glycol (SYSTANE) 0.4-0.3 % SOLN Place 1 drop into both eyes daily as needed (dry eye).   polyethylene glycol powder (MIRALAX) 17 GM/SCOOP powder Take 1 Container by mouth as needed.   pravastatin (PRAVACHOL) 20 MG tablet Take 20 mg by mouth daily.   Probiotic Product (PROBIOTIC PO) Take 1 tablet by mouth daily.   sodium chloride (OCEAN) 0.65 % SOLN nasal spray Place 1 spray into  both nostrils daily as needed for congestion.   tiZANidine (ZANAFLEX) 4 MG tablet Take 2-4 mg by mouth 3 (three) times daily as needed for muscle spasms.   TURMERIC PO Take 400 mg by mouth daily.   valACYclovir (VALTREX)  500 MG tablet TAKE ONE TABLET BY MOUTH DAILY FOR SUPPRESSIVE THERAPY AND INCREASE TO TWO TIMES A DAY FOR THREE DAYS WITH ANY NEW SYMPTOMS   Past Medical History:  Diagnosis Date   Anxiety    Arthritis    Chronic RLQ pain 1989   secondary to adhesions   Diverticulosis of colon (without mention of hemorrhage)    Esophageal obstruction    GERD with stricture    Hx of adenomatous colonic polyps 10/07/2015   Hyperlipidemia    NO MEDS   Hypertension    Osteopenia    Pneumonia    Hx. Last time 2018   PONV (postoperative nausea and vomiting)    Pre-diabetes    Premature menopause age 56   On HRT age 70 - 2007   Smoker    Trigger thumb of right hand 09/2016   Umbilical hernia    Vitamin D deficiency    Past Surgical History:  Procedure Laterality Date   BREAST BIOPSY Left 05/15/2022   [place] LT BREAST BX W LOC DEV 1ST LESION IMG BX SPEC US GUIDE 05/15/2022 [facility]   BREAST BIOPSY  07/02/2022   MM LT RADIOACTIVE SEED LOC MAMMO GUIDE 07/02/2022 [facility]   BREAST EXCISIONAL BIOPSY Right 06/11/2006   BUNIONECTOMY Right    CHEILECTOMY Right 11/2004   great toe    COLONOSCOPY  1988   normal   COLONOSCOPY  11/1996   normal   COLONOSCOPY  01/2002   normal   COLONOSCOPY  03/2022   COLONOSCOPY W/ BIOPSIES  03/2008   mild diverticula - recheck 7 years   ESOPHAGOGASTRODUODENOSCOPY N/A 11/08/2016   Procedure: ESOPHAGOGASTRODUODENOSCOPY (EGD);  Surgeon: Iva Boop, MD;  Location: [facility];  Service: Endoscopy;  Laterality: N/A;   HYSTEROSCOPY  2003   LAPAROSCOPIC OVARIAN CYSTECTOMY Right 1985   MM BREAST STEREO BX*L*R/S Right 06/2006   bloody nipple discharge - path reveals intraductal papilloma Dr. Maple Hudson   RADIOACTIVE SEED GUIDED EXCISIONAL  BREAST BIOPSY Left 07/03/2022   Procedure: RADIOACTIVE SEED GUIDED EXCISIONAL BREAST BIOPSY;  Surgeon: Emelia Loron, MD;  Location: [facility];  Service: General;  Laterality: Left;  LMA   TONSILLECTOMY     As a child   TRIGGER FINGER RELEASE Right 09/18/2016   Procedure: RELEASE TRIGGER FINGER/A-1 PULLEY RIGHT THUMB;  Surgeon: Cindee Salt, MD;  Location: [facility];  Service: Orthopedics;  Laterality: Right;  REG/FAB   UPPER GASTROINTESTINAL ENDOSCOPY     Social History   Social History Narrative   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     family history includes Colon cancer in an other family member; Colon cancer (age of onset: 6) in her maternal uncle; Colon polyps in her maternal uncle and another family member; Heart failure in her maternal grandfather and mother; Hyperlipidemia in her mother; Hypertension in her brother.   Review of Systems As per HPI  Objective:   Physical Exam BP 112/74   Pulse 67   Ht 5\' 9"  (1.753 m)   Wt 242 lb (109.8 kg)   LMP 09/17/1990 (Approximate)   BMI 35.74 kg/m  Physical Exam   CHEST: Lungs clear to auscultation. CARDIOVASCULAR: Heart sounds normal. ABDOMEN: Bowel sounds present. Soft, reducible umbilical hernia, nontender. Obese.No masses. Alert and oriented

## 2023-01-23 ENCOUNTER — Telehealth: Payer: Self-pay | Admitting: Internal Medicine

## 2023-01-23 NOTE — Telephone Encounter (Signed)
PT was referred to [facility] office for a liver fibro scan and would like to further discuss how to get it done through [place] because she does not want to switch GI doctors. Please advise

## 2023-01-23 NOTE — Telephone Encounter (Signed)
Returned patient call & she says PCP is referring her to [facility] for a liver fibroscan. PCP didn't realize she was already a patient of ours, and patient prefers to stay with Dr. Leone Payor. She'd like to know if Dr. Leone Payor could review her labs & order fibroscan if he feels it's necessary. She's waiting on a call back from PCP office, but believes it was ordered d/t elevation in LFT's -AST 35 (started on pravastatin recently). Fax sent to PCP office requesting labs be faxed to Dr. Leone Payor. Patient will also request results be faxed to [place].

## 2023-01-24 NOTE — Telephone Encounter (Signed)
I suspect the fibroscan can wait.  If she wants me to review all this she will need an office visit

## 2023-01-24 NOTE — Telephone Encounter (Signed)
Left message for patient to call back. Also, spoke with PCP office & they have nurse forwarded lab work to our office at [phone].

## 2023-01-25 NOTE — Telephone Encounter (Signed)
Pt made aware of Dr. Leone Payor recommendations: Pt was scheduled to see Dr. Leone Payor on 03/21/2023 at 9:10 AM. Pt made aware. Pt verbalized understanding with all questions answered.

## 2023-01-25 NOTE — Telephone Encounter (Signed)
Patient returned call

## 2023-01-25 NOTE — Telephone Encounter (Signed)
Lab work from her PCP came and has been placed in Dr Marvell Fuller office for review.

## 2023-01-28 ENCOUNTER — Telehealth: Payer: Self-pay | Admitting: Internal Medicine

## 2023-01-28 NOTE — Telephone Encounter (Signed)
LFT's received  01/11/23  AST 32 (< 35) ALT 35 (<25) T bili 0.6 Alk phos 71  PLT NL WBC NL Hgb 13.6  Ferritin 19.9 (10-291)  A1C 6.1  Patient coming in Jan for discussion re: abnl transaminases - PCp was going to do fibroscan

## 2023-02-28 ENCOUNTER — Encounter: Payer: Self-pay | Admitting: Internal Medicine

## 2023-03-15 ENCOUNTER — Encounter: Payer: Medicare Other | Admitting: Internal Medicine

## 2023-03-21 ENCOUNTER — Ambulatory Visit: Payer: Medicare Other | Admitting: Internal Medicine

## 2023-04-11 ENCOUNTER — Encounter: Payer: Self-pay | Admitting: Nurse Practitioner

## 2023-05-18 ENCOUNTER — Other Ambulatory Visit: Payer: Self-pay | Admitting: Internal Medicine

## 2023-05-28 ENCOUNTER — Ambulatory Visit: Payer: Medicare Other | Admitting: Internal Medicine

## 2023-05-28 ENCOUNTER — Telehealth: Payer: Self-pay | Admitting: Internal Medicine

## 2023-05-28 NOTE — Telephone Encounter (Signed)
 Good afternoon Dr. Leone Payor,    Patient called stating that she had missed calls from our practice and was wanting to check on her appointment. I advised patient that she had an appointment with you today 3/11 at 10:30 and she stated she had not received any reminder calls. She stated she was very sorry and wanted to reschedule.    Patient was rescheduled with Hyacinth Meeker  on 5/5  10:20

## 2023-05-31 LAB — LAB REPORT - SCANNED
A1c: 5.8
EGFR (Non-African Amer.): 78
Free T4: 1.14 ng/dL
PTH: 51.4
TSH: 1.27 (ref 0.41–5.90)

## 2023-06-19 ENCOUNTER — Other Ambulatory Visit: Payer: Self-pay | Admitting: Nurse Practitioner

## 2023-06-19 DIAGNOSIS — Z1231 Encounter for screening mammogram for malignant neoplasm of breast: Secondary | ICD-10-CM

## 2023-07-04 ENCOUNTER — Ambulatory Visit
Admission: RE | Admit: 2023-07-04 | Discharge: 2023-07-04 | Disposition: A | Discharge status: Home / Self Care | Source: Ambulatory Visit | Attending: Nurse Practitioner | Admitting: Nurse Practitioner

## 2023-07-04 DIAGNOSIS — Z1231 Encounter for screening mammogram for malignant neoplasm of breast: Secondary | ICD-10-CM

## 2023-07-22 ENCOUNTER — Encounter: Payer: Self-pay | Admitting: Physician Assistant

## 2023-07-22 ENCOUNTER — Other Ambulatory Visit (INDEPENDENT_AMBULATORY_CARE_PROVIDER_SITE_OTHER)

## 2023-07-22 ENCOUNTER — Ambulatory Visit: Admitting: Physician Assistant

## 2023-07-22 VITALS — BP 124/80 | HR 86 | Ht 69.0 in | Wt 252.0 lb

## 2023-07-22 DIAGNOSIS — R7989 Other specified abnormal findings of blood chemistry: Secondary | ICD-10-CM

## 2023-07-22 DIAGNOSIS — K219 Gastro-esophageal reflux disease without esophagitis: Secondary | ICD-10-CM

## 2023-07-22 DIAGNOSIS — K5900 Constipation, unspecified: Secondary | ICD-10-CM

## 2023-07-22 LAB — COMPREHENSIVE METABOLIC PANEL WITH GFR
ALT: 19 U/L (ref 0–35)
AST: 19 U/L (ref 0–37)
Albumin: 4.3 g/dL (ref 3.5–5.2)
Alkaline Phosphatase: 62 U/L (ref 39–117)
BUN: 16 mg/dL (ref 6–23)
CO2: 30 meq/L (ref 19–32)
Calcium: 9.5 mg/dL (ref 8.4–10.5)
Chloride: 100 meq/L (ref 96–112)
Creatinine, Ser: 0.74 mg/dL (ref 0.40–1.20)
GFR: 82.37 mL/min (ref >60.00)
Glucose, Bld: 120 mg/dL — ABNORMAL HIGH (ref 70–99)
Potassium: 4.3 meq/L (ref 3.5–5.1)
Sodium: 138 meq/L (ref 135–145)
Total Bilirubin: 0.6 mg/dL (ref 0.2–1.2)
Total Protein: 6.7 g/dL (ref 6.0–8.3)

## 2023-07-22 LAB — CBC WITH DIFFERENTIAL/PLATELET
Basophils Absolute: 0.1 10*3/uL (ref 0.0–0.1)
Basophils Relative: 0.9 % (ref 0.0–3.0)
Eosinophils Absolute: 0.2 10*3/uL (ref 0.0–0.7)
Eosinophils Relative: 2.2 % (ref 0.0–5.0)
HCT: 41.1 % (ref 36.0–46.0)
Hemoglobin: 13.8 g/dL (ref 12.0–15.0)
Lymphocytes Relative: 23.6 % (ref 12.0–46.0)
Lymphs Abs: 1.9 10*3/uL (ref 0.7–4.0)
MCHC: 33.6 g/dL (ref 30.0–36.0)
MCV: 88.5 fl (ref 78.0–100.0)
Monocytes Absolute: 0.7 10*3/uL (ref 0.1–1.0)
Monocytes Relative: 9.3 % (ref 3.0–12.0)
Neutro Abs: 5.2 10*3/uL (ref 1.4–7.7)
Neutrophils Relative %: 64 % (ref 43.0–77.0)
Platelets: 343 10*3/uL (ref 150.0–400.0)
RBC: 4.64 Mil/uL (ref 3.87–5.11)
RDW: 13.3 % (ref 11.5–15.5)
WBC: 8 10*3/uL (ref 4.0–10.5)

## 2023-07-22 NOTE — Patient Instructions (Signed)
 Your provider has requested that you go to the basement level for lab work before leaving today. Press "B" on the elevator. The lab is located at the first door on the left as you exit the elevator.  _______________________________________________________  If your blood pressure at your visit was 140/90 or greater, please contact your primary care physician to follow up on this.  _______________________________________________________  If you are age 70 or older, your body mass index should be between 23-30. Your Body mass index is 37.21 kg/m. If this is out of the aforementioned range listed, please consider follow up with your Primary Care Provider.  If you are age 72 or younger, your body mass index should be between 19-25. Your Body mass index is 37.21 kg/m. If this is out of the aformentioned range listed, please consider follow up with your Primary Care Provider.   ________________________________________________________  The [place] [facility] providers would like to encourage you to use MYCHART to communicate with providers for non-urgent requests or questions.  Due to long hold times on the telephone, sending your provider a message by [facility] may be a faster and more efficient way to get a response.  Please allow 48 business hours for a response.  Please remember that this is for non-urgent requests.  _______________________________________________________

## 2023-07-22 NOTE — Progress Notes (Addendum)
 Chief Complaint: Right lower quadrant pain  HPI:    Alexandria Mercado is a 68 year old female with a past medical history as listed below including GERD and adenomatous polyps, known to Dr. Willy Harvest, who was referred to me by Earlis Glimpse, NP for a complaint of right lower quadrant pain.      11/08/2016 EGD with LA grade a reflux esophagitis, mild Schatzki's ring and otherwise normal.  She was started on Pantoprazole  40 mg daily.    04/18/2022 colonoscopy done for history of adenomatous polyps with diverticulosis in the sigmoid colon and descending colon, repeat recommended in 5 years.    01/11/2023 LFTs with an AST of 32, ALT 35, T. bili 0.6 and alk phos 71, platelets normal.  Ferritin 19.9.  A1c 6.1.  At that time noted that PCP was going to do a FibroScan.    Today, the patient tells me she thinks she had her LFTs redrawn in April but does not know the result, she thinks actually they were better.  She is wondering if she still needs to have her FibroScan.  Does tell me that she has a constellation of prediabetes, obesity and some high cholesterol which she is working on.  She denies any right upper quadrant pain or symptoms.  She is taking a probiotic as well as Pantoprazole  40 mg daily which help her reflux and constipation.  Denies any new GI complaints or concerns.    Patient does briefly discussed that she was supposed to have an EGD for some dysphagia symptoms but these are better now.  She would rather not pursue unless needed.    Denies fever, chills, weight loss or blood in her stool.  Past Medical History:  Diagnosis Date   Anxiety    Arthritis    Chronic RLQ pain 1989   secondary to adhesions   Diverticulosis of colon (without mention of hemorrhage)    Esophageal obstruction    GERD with stricture    Hx of adenomatous colonic polyps 10/07/2015   Hyperlipidemia    NO MEDS   Hypertension    Osteopenia    Pneumonia    Hx. Last time 2018   PONV (postoperative nausea and vomiting)     Pre-diabetes    Premature menopause age 74   On HRT age 16 - 2007   Smoker    Trigger thumb of right hand 09/2016   Umbilical hernia    Vitamin D  deficiency     Past Surgical History:  Procedure Laterality Date   BREAST BIOPSY Left 05/15/2022   US  LT BREAST BX W LOC DEV 1ST LESION IMG BX SPEC US  GUIDE 05/15/2022 [facility]   BREAST BIOPSY  07/02/2022   MM LT RADIOACTIVE SEED LOC MAMMO GUIDE 07/02/2022 [facility]   BREAST EXCISIONAL BIOPSY Right 06/11/2006   BUNIONECTOMY Right    CHEILECTOMY Right 11/2004   great toe    COLONOSCOPY  1988   normal   COLONOSCOPY  11/1996   normal   COLONOSCOPY  01/2002   normal   COLONOSCOPY  03/2022   COLONOSCOPY W/ BIOPSIES  03/2008   mild diverticula - recheck 7 years   ESOPHAGOGASTRODUODENOSCOPY N/A 11/08/2016   Procedure: ESOPHAGOGASTRODUODENOSCOPY (EGD);  Surgeon: Kenney Peacemaker, MD;  Location: [facility];  Service: Endoscopy;  Laterality: N/A;   HYSTEROSCOPY  2003   LAPAROSCOPIC OVARIAN CYSTECTOMY Right 1985   MM BREAST STEREO BX*L*R/S Right 06/2006   bloody nipple discharge - path reveals intraductal papilloma Dr. Linder Revere   RADIOACTIVE  SEED GUIDED EXCISIONAL BREAST BIOPSY Left 07/03/2022   Procedure: RADIOACTIVE SEED GUIDED EXCISIONAL BREAST BIOPSY;  Surgeon: Enid Harry, MD;  Location: [facility];  Service: General;  Laterality: Left;  LMA   TONSILLECTOMY     As a child   TRIGGER FINGER RELEASE Right 09/18/2016   Procedure: RELEASE TRIGGER FINGER/A-1 PULLEY RIGHT THUMB;  Surgeon: Lyanne Sample, MD;  Location: [facility];  Service: Orthopedics;  Laterality: Right;  REG/FAB   UPPER GASTROINTESTINAL ENDOSCOPY      Current Outpatient Medications  Medication Sig Dispense Refill   albuterol (VENTOLIN HFA) 108 (90 Base) MCG/ACT inhaler Inhale 1 puff into the lungs every 6 (six) hours as needed for wheezing or shortness of breath.     ALPRAZolam (XANAX) 0.5 MG tablet Take 0.5 mg by mouth every 8 (eight) hours as  needed for anxiety.     alum & mag hydroxide-simeth (MAALOX/MYLANTA) 200-200-20 MG/5ML suspension Take 30 mLs by mouth every 6 (six) hours as needed for indigestion or heartburn (Upset stomach, nausea).     ascorbic acid (VITAMIN C) 500 MG tablet Take 500 mg by mouth daily.     ASPIRIN 81 PO Take 81 mg by mouth daily.     calcium carbonate (TUMS EX) 750 MG chewable tablet Chew 2 tablets by mouth daily as needed for heartburn (nausea).     Cholecalciferol (VITAMIN D3) 50 MCG (2000 UT) CAPS Take 2,000 Units by mouth daily.     Cranberry 400 MG CAPS Take 400 mg by mouth daily.     cyclobenzaprine (FLEXERIL) 10 MG tablet Take 10 mg by mouth 3 (three) times daily as needed.     cyclobenzaprine (FLEXERIL) 5 MG tablet Take 5 mg by mouth 3 (three) times daily as needed for muscle spasms.     escitalopram (LEXAPRO) 10 MG tablet Take 10 mg by mouth daily.     glucosamine-chondroitin 500-400 MG tablet Take 1 tablet by mouth daily.     hydrochlorothiazide (HYDRODIURIL) 25 MG tablet Take 25 mg by mouth daily.  1   ibuprofen (ADVIL,MOTRIN) 400 MG tablet Take 200-400 mg by mouth every 6 (six) hours as needed for moderate pain or mild pain.     metoprolol succinate (TOPROL-XL) 50 MG 24 hr tablet Take 50 mg by mouth daily.     Multiple Vitamin (MULTIVITAMIN) tablet Take 1 tablet by mouth daily.     OVER THE COUNTER MEDICATION Take 1 tablet by mouth daily. ultima flora     pantoprazole  (PROTONIX ) 40 MG tablet TAKE ONE TABLET BY MOUTH DAILY BEFORE BREAKFAST 90 tablet 1   Polyethyl Glycol-Propyl Glycol (SYSTANE) 0.4-0.3 % SOLN Place 1 drop into both eyes daily as needed (dry eye).     polyethylene glycol powder (MIRALAX) 17 GM/SCOOP powder Take 1 Container by mouth as needed.     pravastatin (PRAVACHOL) 20 MG tablet Take 20 mg by mouth daily.     Probiotic Product (PROBIOTIC PO) Take 1 tablet by mouth daily.     sodium chloride  (OCEAN) 0.65 % SOLN nasal spray Place 1 spray into both nostrils daily as needed for  congestion.     tiZANidine (ZANAFLEX) 4 MG tablet Take 2-4 mg by mouth 3 (three) times daily as needed for muscle spasms.     TURMERIC PO Take 400 mg by mouth daily.     valACYclovir  (VALTREX ) 500 MG tablet TAKE ONE TABLET BY MOUTH DAILY FOR SUPPRESSIVE THERAPY AND INCREASE TO TWO TIMES A DAY FOR THREE DAYS WITH ANY NEW  SYMPTOMS 90 tablet 1   acetaminophen  (TYLENOL ) 500 MG tablet Take 1,000 mg by mouth every 6 (six) hours as needed for moderate pain or mild pain. (Patient not taking: Reported on 07/22/2023)     benzonatate (TESSALON) 100 MG capsule Take 100 mg by mouth daily as needed for cough. (Patient not taking: Reported on 07/22/2023)     No current facility-administered medications for this visit.    Allergies as of 07/22/2023 - Review Complete 07/22/2023  Allergen Reaction Noted   Amoxicillin-pot clavulanate  03/09/2008   Ceclor [cefaclor]  03/09/2008   Codeine  03/09/2008   Doxycycline  02/26/2018   Keflex [cephalexin]  05/26/2014    Family History  Problem Relation Age of Onset   Hyperlipidemia Mother    Heart failure Mother    Hypertension Brother    Colon polyps Maternal Uncle    Colon cancer Maternal Uncle 60   Heart failure Maternal Grandfather    Colon polyps Other    Colon cancer Other    Esophageal cancer Neg Hx    Rectal cancer Neg Hx    Stomach cancer Neg Hx     Social History   Socioeconomic History   Marital status: Married    Spouse name: Not on file   Number of children: Not on file   Years of education: Not on file   Highest education level: Not on file  Occupational History   Not on file  Tobacco Use   Smoking status: Former    Current packs/day: 0.00    Types: Cigarettes    Quit date: 03/19/2000    Years since quitting: 23.3   Smokeless tobacco: Never  Vaping Use   Vaping status: Never Used  Substance and Sexual Activity   Alcohol use: No    Alcohol/week: 0.0 standard drinks of alcohol   Drug use: No   Sexual activity: Not Currently     Partners: Male    Birth control/protection: Post-menopausal  Other Topics Concern   Not on file  Social History Narrative   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     Social Drivers of Corporate investment banker Strain: Not on file  Food Insecurity: Not on file  Transportation Needs: Not on file  Physical Activity: Not on file  Stress: Not on file  Social Connections: Not on file  Intimate Partner Violence: Not on file    Review of Systems:    Constitutional: No weight loss, fever or chills Cardiovascular: No chest pain Respiratory: No SOB  Gastrointestinal: See HPI and otherwise negative   Physical Exam:  Vital signs: BP 124/80   Pulse 86   Ht 5\' 9"  (1.753 m)   Wt 252 lb (114.3 kg)   LMP 09/17/1990 (Approximate)   BMI 37.21 kg/m   Constitutional:   Pleasant overweight Caucasian female appears to be in NAD, Well developed, Well nourished, alert and cooperative Respiratory: Respirations even and unlabored. Lungs clear to auscultation bilaterally.   No wheezes, crackles, or rhonchi.  Cardiovascular: Normal S1, S2. No MRG. Regular rate and rhythm. No peripheral edema, cyanosis or pallor.  Gastrointestinal:  Soft, nondistended, nontender. No rebound or guarding. Normal bowel sounds. No appreciable masses or hepatomegaly. Rectal:  Not performed.  Psychiatric: Demonstrates good judgement and reason without abnormal affect or behaviors.  RELEVANT LABS AND IMAGING: CBC    Component Value Date/Time   WBC 9.1 06/26/2022 1000   RBC 4.37 06/26/2022 1000   HGB 13.4 06/26/2022 1000  HGB 13.8 05/31/2015 1052   HCT 40.2 06/26/2022 1000   PLT 339 06/26/2022 1000   MCV 92.0 06/26/2022 1000   MCH 30.7 06/26/2022 1000   MCHC 33.3 06/26/2022 1000   RDW 13.1 06/26/2022 1000   LYMPHSABS 2.8 04/15/2008 1805   MONOABS 0.5 04/15/2008 1805   EOSABS 0.1 04/15/2008 1805   BASOSABS 0.0 04/15/2008 1805    CMP     Component Value Date/Time   NA 138 06/26/2022 1000   K 3.9  06/26/2022 1000   CL 101 06/26/2022 1000   CO2 28 06/26/2022 1000   GLUCOSE 113 (H) 06/26/2022 1000   BUN 15 06/26/2022 1000   CREATININE 0.84 06/26/2022 1000   CREATININE 0.59 05/31/2015 1052   CALCIUM 9.1 06/26/2022 1000   PROT 6.7 05/31/2015 1052   ALBUMIN 4.5 05/31/2015 1052   AST 15 05/31/2015 1052   ALT 12 05/31/2015 1052   ALKPHOS 71 05/31/2015 1052   BILITOT 0.4 05/31/2015 1052   GFRNONAA >60 06/26/2022 1000   GFRAA  04/15/2008 1805    >60        The eGFR has been calculated using the MDRD equation. This calculation has not been validated in all clinical situations. eGFR's persistently <60 mL/min signify possible Chronic Kidney Disease.    Assessment: 1.  Elevated LFTs: Minimally elevated ALT back in January, patient believes she had repeat labs and it was normal; high possibility of fatty liver given metabolic disorder 2.  GERD: Better with Pantoprazole  40 mg daily, no further dysphagia symptoms 3.  Constipation: Better with a probiotic "friendly flora"  Plan: 1.  Recheck LFTs today, CBC and CMP ordered. 2.  Patient to continue Pantoprazole  and probiotic as these are working for her. 3.  Patient to follow in clinic with us  as needed.  Reginal Capra, PA-C [facility] 07/22/2023, 10:18 AM  Cc: Earlis Glimpse, NP   Primary gastroenterologist  Agree w/ above  CBC and CMET were NL today and FIB-4 calculation to assess fatty liver diseases is 0.88 and advanced fibrosis is excluded and there is no need for elastography or Fibro-scan  Kenney Peacemaker, MD, [facility]    08/14/2023 2:27 PM addendum  Received labs for patient  05/31/2023 triglycerides elevated 161, A1c 55.8, CBC normal, CMP with a glucose elevated 127 otherwise normal, TSH 1.267, free T41.14, vitamin D  normal at 53.62.  No change to plans  Reginal Capra, PA-C

## 2023-08-29 ENCOUNTER — Encounter: Payer: Self-pay | Admitting: Nurse Practitioner

## 2023-11-20 ENCOUNTER — Other Ambulatory Visit: Payer: Self-pay | Admitting: Internal Medicine

## 2024-03-24 ENCOUNTER — Other Ambulatory Visit: Payer: Self-pay | Admitting: Nurse Practitioner

## 2024-03-24 NOTE — Telephone Encounter (Signed)
 Med refill request:   valACYclovir  (VALTREX ) 500 MG tablet  Start:  08/22/22 Disp: 90  tablets Refills:  1  Last OV: 07/26/21 Last AEX:  11/23/20 Next AEX:  05/08/24 Last MMG (if hormonal med):  N/A Refill authorized? Please Advise.

## 2024-04-20 ENCOUNTER — Ambulatory Visit: Admitting: Cardiology

## 2024-05-08 ENCOUNTER — Ambulatory Visit: Payer: Self-pay | Admitting: Nurse Practitioner

## 2024-06-25 ENCOUNTER — Ambulatory Visit: Admitting: Cardiology
